# Patient Record
Sex: Male | Born: 1975 | Race: Asian | Hispanic: No | Marital: Single | Smoking: Never smoker
Health system: Southern US, Community
[De-identification: ages and names within clinical notes are randomized; demographics above are authoritative.]

## PROBLEM LIST (undated history)

## (undated) DIAGNOSIS — K219 Gastro-esophageal reflux disease without esophagitis: Secondary | ICD-10-CM

## (undated) HISTORY — PX: REFRACTIVE SURGERY: SHX103

## (undated) HISTORY — DX: Gastro-esophageal reflux disease without esophagitis: K21.9

---

## 2014-11-22 ENCOUNTER — Encounter: Payer: Self-pay | Admitting: Gastroenterology

## 2014-12-09 HISTORY — PX: COLONOSCOPY: SHX174

## 2014-12-16 ENCOUNTER — Ambulatory Visit (INDEPENDENT_AMBULATORY_CARE_PROVIDER_SITE_OTHER): Payer: BLUE CROSS/BLUE SHIELD | Admitting: Gastroenterology

## 2014-12-16 ENCOUNTER — Other Ambulatory Visit: Payer: Self-pay

## 2014-12-16 ENCOUNTER — Encounter: Payer: Self-pay | Admitting: Gastroenterology

## 2014-12-16 VITALS — BP 143/98 | HR 104 | Temp 98.1°F | Ht 67.0 in | Wt 168.8 lb

## 2014-12-16 DIAGNOSIS — R1031 Right lower quadrant pain: Secondary | ICD-10-CM | POA: Diagnosis not present

## 2014-12-16 MED ORDER — LINACLOTIDE 145 MCG PO CAPS: ORAL_CAPSULE | ORAL | Status: DC

## 2014-12-16 NOTE — Assessment & Plan Note (Addendum)
Acute in onset and improved but persistent.  Associated with change in bowel habits and radiates to right groin. DIFFERENTIAL DIAGNOSIS INCLUDES: small bowel enteropathy, less likely Crohn's pf smal lintestine or occult sb mass.  COMPLETE GIVENS CAPSULE TO EVALUATE SMALL BOWEL. COMPLETE ULTRASOUND OF TESTICLES. ADD LINZESS 30 MINS PRIOR TO BREAKFAST. IT MAY CAUSE EXPLOSIVE DIARRHEA. BREAK LINZESS CAPSULES AND PUT IN 4 TBSP WATER. TAKE 2 TBSP DAILY. IF NOT HAVING A BOWEL MOVEMENT 2 OR 3 TIMES A WEEK THAN TAKE 3 TBSP DAILY. Pt will call with questions. OUTPATIENT VISIT IN 6 WEEKS.  GREATER THAN 50% WAS SPENT IN COUNSELING & COORDINATION OF CARE WITH THE PATIENT: DISCUSSED DIFFERENTIAL DIAGNOSIS, PROCEDURE, BENEFITS, RISKS, AND MANAGEMENT OF chronicRLQ abdominal pain/constipation. DISCUSSED BENEFITS, AND MANAGEMENT OF CONSTIPATION AND MEDICATION TITRATION. TOTAL ENCOUNTER TIME: 60 MINS.

## 2014-12-16 NOTE — Patient Instructions (Addendum)
COMPLETE GIVENS CAPSULE TO EVALUATE SMALL BOWEL.  COMPLETE ULTRASOUND OF TESTICLES.  ADD LINZESS 30 MINS PRIOR TO BREAKFAST. IT MAY CAUSE EXPLOSIVE DIARRHEA. BREAK LINZESS CAPSULES AND PUT IN 4 TBSP WATER. TAKE 2 TBSP DAILY. IF YOU ARE NOT HAVING A SATISFACTORY BOWEL MOVEMENT 2 OR 3 TIMES A WEEK THAN TAKE 3 TBSP DAILY.   OUTPATIENT VISIT IN 6 WEEKS.

## 2014-12-16 NOTE — Progress Notes (Signed)
Subjective:    Patient ID: Alexander Aguirre, male    DOB: 1975-09-24, 39 y.o.   MRN: [id number]  Josefa Half, MD  HPI 2 WEEKS PRIOR TO SX: HAD BAD GASTROENTERITIS(LOST WATER DIARRHEA, RUMBLING). HAD SAME THING 3-4 YEARS AGO. DRANK A LOT. NO VOMITING. NO BLOOD STOOL. NO ASPIRIN, BC/GOODY POWDERS, IBUPROFEN/MOTRIN, OR NAPROXEN/ALEVE. ETOH SOMETIMES. Started OCT 2016 ON A SUN. ALWAYS RLQ OFF AND ON(905 IN RLQ, RADIATE TO R GROIN. SAW FRIEND WHO WAS A GI. LABS: NL. CT SCAN: CONSTIPATION. LAST FRI-TCS SMALL POLYP REMOVED. AFTER TCS MIRALAX FOR 2 WEEKS. STARTED IT SAT.  FEELS LITTLE BETTER, BUT PAIN IN RLQ/SUPRPAUBIC AND RADIATED INTO THE GROIN. NO DYSURIA OR HEMATURIA. NO TRAVEL OR ABX. BMs: ONCE A DAY, MAYBE STRAINING. EATS AND THEN HE RUMBLES INSIDE. WEIGHT LOSS: NO CHANGE IN APPETITE: NO. LESS ETOH NOW. USUALLY EXERCISES 4-5 DAYS A WEEK AND NOW NOT EXERCISING(TREADMILL OR ELLIPTICAL, RARELY LIFT WEIGHTS). HAD FEVER/COLD ASSOCIATED WITH URI. NO PAST INJURY OR MVs. LIVING: [employer]. CHEST PAIN ON SUN AFTER TCS AND WENT TO BATHROOM AND FELT FINE.   PT DENIES FEVER, CHILLS, HEMATOCHEZIA, nausea, vomiting, melena, diarrhea, CHEST PAIN, SHORTNESS OF BREATH,  CHANGE IN BOWEL IN HABITS,  problems swallowing, OR  heartburn or indigestion. NO SORES IN MOUTH, RASH ON LEGS, JOINT PAIN/SWELLING, OR BACK PAIN.  No past medical history on file.  Past Surgical History  Procedure Laterality Date  . Refractive surgery    . Colonoscopy  12/09/14    in [place]   No Known Allergies  Current Outpatient Prescriptions  Medication Sig Dispense Refill  . polyethylene glycol (MIRALAX / GLYCOLAX) packet Take 17 g by mouth daily.     No family history on file.  Social History   Social History  . Marital Status: Single    Spouse Name: N/A  . Number of Children: N/A  . Years of Education: N/A   Occupational History  . Not on file.   Social History Main Topics  . Smoking status: Never  Smoker   . Smokeless tobacco: Not on file  . Alcohol Use: 0.0 oz/week    0 Standard drinks or equivalent per week     Comment: 3-4 drinks in a week  . Drug Use: No  . Sexual Activity: Not on file   Review of Systems PER HPI OTHERWISE ALL SYSTEMS ARE NEGATIVE.    Objective:   Physical Exam  Constitutional: He is oriented to person, place, and time. He appears well-developed and well-nourished. No distress.  HENT:  Head: Normocephalic and atraumatic.  Mouth/Throat: Oropharynx is clear and moist. No oropharyngeal exudate.  Eyes: Pupils are equal, round, and reactive to light. No scleral icterus.  Neck: Normal range of motion. Neck supple.  Cardiovascular: Normal rate, regular rhythm and normal heart sounds.   Pulmonary/Chest: Effort normal and breath sounds normal. No respiratory distress.  Abdominal: Soft. Bowel sounds are normal. He exhibits no distension. There is tenderness. There is no rebound and no guarding. Hernia confirmed negative in the right inguinal area and confirmed negative in the left inguinal area.  MILD RLQs TTP   Genitourinary: Testes normal and penis normal. Right testis shows no mass, no swelling and no tenderness. Left testis shows no mass, no swelling and no tenderness.  Musculoskeletal: He exhibits no edema.  Lymphadenopathy:    He has no cervical adenopathy.       Right: No inguinal adenopathy present.  Neurological: He is alert and oriented to person, place,  and time.  NO FOCAL DEFICITS  Skin:  NO PRETIBIAL LESIONS  Psychiatric: He has a normal mood and affect.  Vitals reviewed.     Assessment & Plan:

## 2014-12-16 NOTE — Progress Notes (Signed)
I PERSONALLY [facility] [facility] ([facility]WITH DR. Tyron RussellBOLES. HIS REVIEW REVEALED NO ACUTE INTRA-ABDOMINAL PATHOLOGY. SPOKE WITH MR. Chew. HE IS AWARE.

## 2014-12-20 ENCOUNTER — Other Ambulatory Visit: Payer: Self-pay | Admitting: Gastroenterology

## 2014-12-20 DIAGNOSIS — R1031 Right lower quadrant pain: Secondary | ICD-10-CM

## 2014-12-22 ENCOUNTER — Telehealth: Payer: Self-pay

## 2014-12-22 ENCOUNTER — Ambulatory Visit (HOSPITAL_COMMUNITY)
Admission: RE | Admit: 2014-12-22 | Discharge: 2014-12-22 | Disposition: A | Discharge status: Home / Self Care | Payer: BLUE CROSS/BLUE SHIELD | Source: Ambulatory Visit | Attending: Gastroenterology | Admitting: Gastroenterology

## 2014-12-22 DIAGNOSIS — R1031 Right lower quadrant pain: Secondary | ICD-10-CM | POA: Insufficient documentation

## 2014-12-22 DIAGNOSIS — N503 Cyst of epididymis: Secondary | ICD-10-CM | POA: Diagnosis not present

## 2014-12-22 DIAGNOSIS — R1030 Lower abdominal pain, unspecified: Secondary | ICD-10-CM | POA: Diagnosis not present

## 2014-12-22 DIAGNOSIS — N433 Hydrocele, unspecified: Secondary | ICD-10-CM | POA: Insufficient documentation

## 2014-12-22 NOTE — Telephone Encounter (Signed)
[employer] faxed office in response to clinical notes faxed and approval for Givens Capsule.   [employer] has denied request.    Provider can do peer to peer by calling [phone] ext [phone]   Case # [id number]

## 2014-12-24 ENCOUNTER — Telehealth: Payer: Self-pay | Admitting: Gastroenterology

## 2014-12-24 NOTE — Telephone Encounter (Signed)
Spoke with Dr. Alvester MorinNewton. Will approve GIVENS CAPSULE.

## 2014-12-24 NOTE — Telephone Encounter (Signed)
PLEASE CALL PT. HIS U/S IS NORMAL. I HAD TO DO A PEER TO PEER REVIEW TO GET [employer] TO APPROVE THE GIVENS. THEY APPROVED IT FRI DEC 9. CANDY WILL CONTACT HIM TO SCHEDULE.

## 2014-12-27 ENCOUNTER — Other Ambulatory Visit: Payer: Self-pay

## 2014-12-27 ENCOUNTER — Telehealth: Payer: Self-pay

## 2014-12-27 DIAGNOSIS — R109 Unspecified abdominal pain: Secondary | ICD-10-CM

## 2014-12-27 DIAGNOSIS — R197 Diarrhea, unspecified: Secondary | ICD-10-CM

## 2014-12-27 NOTE — Telephone Encounter (Signed)
LMOM for a return call.  

## 2014-12-27 NOTE — Telephone Encounter (Signed)
Opened in error

## 2014-12-27 NOTE — Telephone Encounter (Signed)
PT is aware and is.

## 2014-12-27 NOTE — Telephone Encounter (Signed)
PA for Alexander Aguirre is [id number]  Expires 01/15/2015  Pt is to come by office to pick up instructions

## 2014-12-28 NOTE — Telephone Encounter (Signed)
Pt picked up instructions.

## 2014-12-28 NOTE — Telephone Encounter (Signed)
Alexander Aguirre was approved. PA # [id number]

## 2014-12-31 ENCOUNTER — Encounter (HOSPITAL_COMMUNITY): Payer: Self-pay

## 2014-12-31 ENCOUNTER — Ambulatory Visit (HOSPITAL_COMMUNITY)
Admission: RE | Admit: 2014-12-31 | Discharge: 2014-12-31 | Disposition: A | Discharge status: Home / Self Care | Payer: BLUE CROSS/BLUE SHIELD | Source: Ambulatory Visit | Attending: Gastroenterology | Admitting: Gastroenterology

## 2014-12-31 ENCOUNTER — Encounter (HOSPITAL_COMMUNITY)
Admission: RE | Disposition: A | Discharge status: Home / Self Care | Payer: Self-pay | Source: Ambulatory Visit | Attending: Gastroenterology

## 2014-12-31 DIAGNOSIS — K633 Ulcer of intestine: Secondary | ICD-10-CM | POA: Insufficient documentation

## 2014-12-31 DIAGNOSIS — R109 Unspecified abdominal pain: Secondary | ICD-10-CM | POA: Diagnosis not present

## 2014-12-31 DIAGNOSIS — R1031 Right lower quadrant pain: Secondary | ICD-10-CM | POA: Insufficient documentation

## 2014-12-31 DIAGNOSIS — R197 Diarrhea, unspecified: Secondary | ICD-10-CM

## 2014-12-31 HISTORY — PX: GIVENS CAPSULE STUDY: SHX5432

## 2014-12-31 SURGERY — IMAGING PROCEDURE, GI TRACT, INTRALUMINAL, VIA CAPSULE

## 2015-01-04 ENCOUNTER — Encounter (HOSPITAL_COMMUNITY): Payer: Self-pay | Admitting: Gastroenterology

## 2015-01-13 ENCOUNTER — Encounter: Payer: Self-pay | Admitting: Gastroenterology

## 2015-01-13 ENCOUNTER — Telehealth: Payer: Self-pay | Admitting: Gastroenterology

## 2015-01-13 NOTE — Telephone Encounter (Addendum)
OPV IN WITH [facility] JAN 19 AT 0815 E30 ABDOMINAL PAIN.

## 2015-01-13 NOTE — Telephone Encounter (Signed)
Called patient TO DISCUSS RESULTS. STILL HAVING DULL ACHE. HAS GOOD DAYS AND BAD DAYS. BEEN A LITTLE BETTER OVER PAST 2 WEEK AS OF LAST FRI AFTER A PROBIOTIC(ALIGN). NOT HAVING DIARRHEA NOW. FIRST STARTED WITH BAD CHICKEN OCT 2016. CLEARED AFTER GATORADE. 2 WEEKS LATER ATE SAME CHICKEN NOV 2016 AN 1 WEEK PRIOR TO THE PAIN. FELT CONSTIPATED. NO ASPIRIN, BC/GOODY POWDERS, IBUPROFEN/MOTRIN, OR NAPROXEN/ALEVE. WAS TAKING CLA TO LOSE WEIGHT. RARE ETOH. NO KNOWN CELIAC SPRUE. PT WILL CALL IN 2-3 WEEKS IF ABDOMINAL PAIN IS NOT BETTER. DISCUSSED POSSIBLE EGD/DUODENAL BX V. TTG IgA AND/OR TCS WITH ILEAL BIOPSIES.

## 2015-01-18 NOTE — Telephone Encounter (Signed)
NO OPENING THAT DAY EXCEPT FOR 11:30 URGENT OV   PLEASE ADVISE

## 2015-01-20 NOTE — Brief Op Note (Signed)
12/31/2014  11:30 AM  PATIENT:  Alexander Aguirre  40 y.o. male  PRE-OPERATIVE DIAGNOSIS: abdominal pain-DENIES ASA/NSAIDs  POST-OPERATIVE DIAGNOSIS:  SMALL ILEAL ULCERS  PROCEDURE:  Procedure(s) with comments: GIVENS CAPSULE STUDY (N/A) - 0700  SURGEON:  Surgeon(s) and Role:    * West BaliSandi L Bijan Ridgley, MD - Primary   PATIENT DATA: 167 LBS, HEIGHT: 67 IN, WAIST: 36  IN, GASTRIC PASSAGE TIME: 16 m, SB PASSAGE TIME: 4H 233m  RESULTS: LIMITED views of gastric mucosa due to retained contents. No blood in the stomach. SMALL ULCERS SEEN IN SMALL BOWEL No masses, or AVMs SEEN. NO OLD BLOOD OR FRESH BLOOD SEEN. LIMITED VIEWS OF THE COLON DUE TO RETAINED CONTENTS.  DIAGNOSIS: ABDOMINAL PAIN POSSIBLE DUE TO NONSPECIFIC ILEITIS, CROHN'S DISEASE, OR SECONDARY TO CELIAC SPRUE, DOUBT SB LYMPHOMA OR CARCINOID.  Plan: 1. PT WILL CALL IN 2-3 WEEKS IF ABDOMINAL PAIN IS NOT BETTER. DISCUSSED POSSIBLE EGD/DUODENAL BX V. TTG IgA AND/OR TCS WITH ILEAL BIOPSIES  Jan 19 1137: spoke with [name] PRIOR IELOSCOPY NORMAL. PT MAY NEED EGD/RETROGRADE DBE AND/OR TTG IgA.RETROGRADE DBE.

## 2015-01-24 ENCOUNTER — Encounter: Payer: Self-pay | Admitting: Gastroenterology

## 2015-01-25 NOTE — Telephone Encounter (Signed)
APPT MADE AND PATIENT IS AWARE TO BE HERE AT 8:15AM

## 2015-01-25 NOTE — Telephone Encounter (Addendum)
PUT PT IN 1130 SLOT. HE WILL ARRIVE AT [id number]. HE NEEDS AN EARLY AM APPT.

## 2015-01-26 ENCOUNTER — Other Ambulatory Visit: Payer: Self-pay

## 2015-01-26 MED ORDER — NA SULFATE-K SULFATE-MG SULF 17.5-3.13-1.6 GM/177ML PO SOLN: 1.0000 | Freq: Once | ORAL | Status: DC

## 2015-01-27 ENCOUNTER — Other Ambulatory Visit: Payer: Self-pay

## 2015-01-27 ENCOUNTER — Telehealth: Payer: Self-pay | Admitting: Gastroenterology

## 2015-01-27 DIAGNOSIS — R1031 Right lower quadrant pain: Secondary | ICD-10-CM

## 2015-01-27 DIAGNOSIS — K529 Noninfective gastroenteritis and colitis, unspecified: Secondary | ICD-10-CM

## 2015-01-27 DIAGNOSIS — D649 Anemia, unspecified: Secondary | ICD-10-CM

## 2015-01-27 NOTE — Telephone Encounter (Signed)
PT CALLED HAVING NAGGING RLQ ABDOMINAL PAIN. PROBIOTIC IMPROVED PAIN BUT HASN'T RESOLVED. NO DIARRHEA. GIVENS HAS ILEAL ULCERS. NEED EGD/TCS FRI 0730 SUPREP PER PT REQUEST. DISCUSSED PROCEDURE, BENEFITS, AND RISKS OF PROCEDURE.

## 2015-01-27 NOTE — Telephone Encounter (Signed)
Ordered Entered. Pt aware of new arrival time

## 2015-01-28 ENCOUNTER — Ambulatory Visit (HOSPITAL_COMMUNITY)
Admission: RE | Admit: 2015-01-28 | Discharge: 2015-01-28 | Disposition: A | Discharge status: Home / Self Care | Payer: BLUE CROSS/BLUE SHIELD | Source: Ambulatory Visit | Attending: Gastroenterology | Admitting: Gastroenterology

## 2015-01-28 ENCOUNTER — Encounter (HOSPITAL_COMMUNITY): Payer: Self-pay | Admitting: *Deleted

## 2015-01-28 ENCOUNTER — Encounter (HOSPITAL_COMMUNITY)
Admission: RE | Disposition: A | Discharge status: Home / Self Care | Payer: Self-pay | Source: Ambulatory Visit | Attending: Gastroenterology

## 2015-01-28 DIAGNOSIS — K529 Noninfective gastroenteritis and colitis, unspecified: Secondary | ICD-10-CM | POA: Diagnosis not present

## 2015-01-28 DIAGNOSIS — D649 Anemia, unspecified: Secondary | ICD-10-CM

## 2015-01-28 DIAGNOSIS — Q438 Other specified congenital malformations of intestine: Secondary | ICD-10-CM | POA: Insufficient documentation

## 2015-01-28 DIAGNOSIS — R1031 Right lower quadrant pain: Secondary | ICD-10-CM | POA: Diagnosis not present

## 2015-01-28 HISTORY — PX: COLONOSCOPY: SHX5424

## 2015-01-28 HISTORY — PX: COLONOSCOPY: SHX174

## 2015-01-28 HISTORY — PX: ESOPHAGOGASTRODUODENOSCOPY: SHX1529

## 2015-01-28 HISTORY — PX: ESOPHAGOGASTRODUODENOSCOPY: SHX5428

## 2015-01-28 SURGERY — EGD (ESOPHAGOGASTRODUODENOSCOPY)
Anesthesia: Moderate Sedation

## 2015-01-28 MED ORDER — PROMETHAZINE HCL 25 MG/ML IJ SOLN
INTRAMUSCULAR | Status: DC | PRN
  Administered 2015-01-28: 12.5 mg via INTRAVENOUS

## 2015-01-28 MED ORDER — STERILE WATER FOR IRRIGATION IR SOLN
Status: DC | PRN
  Administered 2015-01-28: 08:00:00

## 2015-01-28 MED ORDER — LIDOCAINE VISCOUS 2 % MT SOLN
OROMUCOSAL | Status: AC
  Filled 2015-01-28: qty 15

## 2015-01-28 MED ORDER — SODIUM CHLORIDE 0.9 % IJ SOLN
INTRAMUSCULAR | Status: AC
  Filled 2015-01-28: qty 3

## 2015-01-28 MED ORDER — SODIUM CHLORIDE 0.9 % IV SOLN
INTRAVENOUS | Status: DC
  Administered 2015-01-28: 1000 mL via INTRAVENOUS

## 2015-01-28 MED ORDER — MIDAZOLAM HCL 5 MG/5ML IJ SOLN
INTRAMUSCULAR | Status: DC | PRN
  Administered 2015-01-28: 1 mg via INTRAVENOUS
  Administered 2015-01-28 (×2): 2 mg via INTRAVENOUS

## 2015-01-28 MED ORDER — MEPERIDINE HCL 100 MG/ML IJ SOLN
INTRAMUSCULAR | Status: AC
  Filled 2015-01-28: qty 2

## 2015-01-28 MED ORDER — PROMETHAZINE HCL 25 MG/ML IJ SOLN
INTRAMUSCULAR | Status: AC
  Filled 2015-01-28: qty 1

## 2015-01-28 MED ORDER — MEPERIDINE HCL 100 MG/ML IJ SOLN
INTRAMUSCULAR | Status: DC | PRN
  Administered 2015-01-28: 25 mg via INTRAVENOUS
  Administered 2015-01-28: 50 mg via INTRAVENOUS
  Administered 2015-01-28: 25 mg via INTRAVENOUS

## 2015-01-28 MED ORDER — MIDAZOLAM HCL 5 MG/5ML IJ SOLN
INTRAMUSCULAR | Status: AC
  Filled 2015-01-28: qty 10

## 2015-01-28 MED ORDER — LIDOCAINE VISCOUS 2 % MT SOLN
OROMUCOSAL | Status: DC | PRN
Start: 2015-01-28 — End: 2015-01-28
  Administered 2015-01-28: 1 via OROMUCOSAL

## 2015-01-28 NOTE — H&P (Signed)
  Primary Care Physician:  Cristal Ford, MD Primary Gastroenterologist:  Dr. Oneida Alar  Pre-Procedure History & Physical: HPI:  Alexander Aguirre is a 40 y.o. male here for Abdominal pain/ileitis on GIVENS.  History reviewed. No pertinent past medical history.  Past Surgical History  Procedure Laterality Date  . Refractive surgery    . Colonoscopy  12/09/14    in [place]  . Givens capsule study N/A 12/31/2014    Procedure: GIVENS CAPSULE STUDY;  Surgeon: Danie Binder, MD;  Location: [facility];  Service: Endoscopy;  Laterality: N/A;  0700    Prior to Admission medications   Medication Sig Start Date End Date Taking? Authorizing Provider  acidophilus (RISAQUAD) CAPS capsule Take 1 capsule by mouth daily.   Yes Historical Provider, MD  Na Sulfate-K Sulfate-Mg Sulf SOLN Take 1 kit by mouth once. 01/26/15 02/25/15 Yes Danie Binder, MD  Linaclotide Rolan Lipa) 145 MCG CAPS capsule 1 PO 30 mins prior to your first meal 12/16/14   Danie Binder, MD  polyethylene glycol (MIRALAX / GLYCOLAX) packet Take 17 g by mouth daily.    Historical Provider, MD    Allergies as of 01/27/2015  . (No Known Allergies)    Family History  Problem Relation Age of Onset  . Colon polyps Father   . Colon cancer Neg Hx     Social History   Social History  . Marital Status: Single    Spouse Name: N/A  . Number of Children: N/A  . Years of Education: N/A   Occupational History  . Not on file.   Social History Main Topics  . Smoking status: Never Smoker   . Smokeless tobacco: Not on file  . Alcohol Use: 0.0 oz/week    0 Standard drinks or equivalent per week     Comment: 3-4 drinks in a week  . Drug Use: No  . Sexual Activity: Not on file   Other Topics Concern  . Not on file   Social History Narrative    Review of Systems: See HPI, otherwise negative ROS   Physical Exam: BP 115/84 mmHg  Pulse 90  Temp(Src) 98.5 F (36.9 C) (Oral)  Resp 14  Ht '5\' 7"'$  (1.702 m)  Wt 165  lb (74.844 kg)  BMI 25.84 kg/m2  SpO2 97% General:   Alert,  pleasant and cooperative in NAD Head:  Normocephalic and atraumatic. Neck:  Supple; Lungs:  Clear throughout to auscultation.    Heart:  Regular rate and rhythm. Abdomen:  Soft, nontender and nondistended. Normal bowel sounds, without guarding, and without rebound.   Neurologic:  Alert and  oriented x4;  grossly normal neurologically.  Impression/Plan:    Abdominal pain/ileitis on GIVENS  PLAN: 1. TCS/EGD/POSSIBLE ENTEROSCOPY TODAY. DISCUSSED PROCEDURE, BENEFITS, & RISKS: < 1% chance of medication reaction, bleeding, perforation, or rupture of spleen/liver.

## 2015-01-28 NOTE — Discharge Instructions (Signed)
Your colon and small bowel looks normal.  I biopsied your stomach and small bowel.   YOUR BIOPSY RESULTS WILL BE AVAILABLE IN MY CHART JAN 17.  CONTINUE  PROBIOTIC DAILY.  USE MIRALAX OR LINZESS IF NEEDED FOR CONSTIPATION.  ENDOSCOPY Care After Read the instructions outlined below and refer to this sheet in the next week. These discharge instructions provide you with general information on caring for yourself after you leave the hospital. While your treatment has been planned according to the most current medical practices available, unavoidable complications occasionally occur. If you have any problems or questions after discharge, call DR. Hutton Pellicane, [phone].  ACTIVITY  You may resume your regular activity, but move at a slower pace for the next 24 hours.   Take frequent rest periods for the next 24 hours.   Walking will help get rid of the air and reduce the bloated feeling in your belly (abdomen).   No driving for 24 hours (because of the medicine (anesthesia) used during the test).   You may shower.   Do not sign any important legal documents or operate any machinery for 24 hours (because of the anesthesia used during the test).    NUTRITION  Drink plenty of fluids.   You may resume your normal diet as instructed by your doctor.   Begin with a light meal and progress to your normal diet. Heavy or fried foods are harder to digest and may make you feel sick to your stomach (nauseated).   Avoid alcoholic beverages for 24 hours or as instructed.    MEDICATIONS  You may resume your normal medications.   WHAT YOU CAN EXPECT TODAY  Some feelings of bloating in the abdomen.   Passage of more gas than usual.   Spotting of blood in your stool or on the toilet paper  .  IF YOU HAD POLYPS REMOVED DURING THE ENDOSCOPY:  Eat a soft diet IF YOU HAVE NAUSEA, BLOATING, ABDOMINAL PAIN, OR VOMITING.    FINDING OUT THE RESULTS OF YOUR TEST Not all test results are  available during your visit. DR. Darrick Penna WILL CALL YOU WITHIN 7 DAYS OF YOUR PROCEDUE WITH YOUR RESULTS. Do not assume everything is normal if you have not heard from DR. Lativia Velie IN ONE WEEK, CALL HER OFFICE AT [phone].  SEEK IMMEDIATE MEDICAL ATTENTION AND CALL THE OFFICE: [phone] IF:  You have more than a spotting of blood in your stool.   Your belly is swollen (abdominal distention).   You are nauseated or vomiting.   You have a temperature over 101F.   You have abdominal pain or discomfort that is severe or gets worse throughout the day.  HIGH FIBER DIET A high-fiber diet changes your normal diet to include more whole grains, legumes, fruits, and vegetables. Changes in the diet involve replacing refined carbohydrates with unrefined foods. The calorie level of the diet is essentially unchanged. The Dietary Reference Intake (recommended amount) for adult males is 38 grams per day. For adult females, it is 25 grams per day. Pregnant and lactating women should consume 28 grams of fiber per day. Fiber is the intact part of a plant that is not broken down during digestion. Functional fiber is fiber that has been isolated from the plant to provide a beneficial effect in the body. PURPOSE  Increase stool bulk.   Ease and regulate bowel movements.   Lower cholesterol.  REDUCE RISK OF COLON CANCER  INDICATIONS THAT YOU NEED MORE FIBER  Constipation and hemorrhoids.  Uncomplicated diverticulosis (intestine condition) and irritable bowel syndrome.   Weight management.   As a protective measure against hardening of the arteries (atherosclerosis), diabetes, and cancer.   GUIDELINES FOR INCREASING FIBER IN THE DIET  Start adding fiber to the diet slowly. A gradual increase of about 5 more grams (2 slices of whole-wheat bread, 2 servings of most fruits or vegetables, or 1 bowl of high-fiber cereal) per day is best. Too rapid an increase in fiber may result in constipation,  flatulence, and bloating.   Drink enough water and fluids to keep your urine clear or pale yellow. Water, juice, or caffeine-free drinks are recommended. Not drinking enough fluid may cause constipation.   Eat a variety of high-fiber foods rather than one type of fiber.   Try to increase your intake of fiber through using high-fiber foods rather than fiber pills or supplements that contain small amounts of fiber.   The goal is to change the types of food eaten. Do not supplement your present diet with high-fiber foods, but replace foods in your present diet.   INCLUDE A VARIETY OF FIBER SOURCES  Replace refined and processed grains with whole grains, canned fruits with fresh fruits, and incorporate other fiber sources. White rice, white breads, and most bakery goods contain little or no fiber.   Brown whole-grain rice, buckwheat oats, and many fruits and vegetables are all good sources of fiber. These include: broccoli, Brussels sprouts, cabbage, cauliflower, beets, sweet potatoes, white potatoes (skin on), carrots, tomatoes, eggplant, squash, berries, fresh fruits, and dried fruits.   Cereals appear to be the richest source of fiber. Cereal fiber is found in whole grains and bran. Bran is the fiber-rich outer coat of cereal grain, which is largely removed in refining. In whole-grain cereals, the bran remains. In breakfast cereals, the largest amount of fiber is found in those with "bran" in their names. The fiber content is sometimes indicated on the label.   You may need to include additional fruits and vegetables each day.   In baking, for 1 cup white flour, you may use the following substitutions:   1 cup whole-wheat flour minus 2 tablespoons.   1/2 cup white flour plus 1/2 cup whole-wheat flour.

## 2015-01-28 NOTE — Op Note (Addendum)
[facility] [address] [place], [id number]   COLONOSCOPY PROCEDURE REPORT  PATIENT: Alexander Aguirre, Alexander Aguirre  MR#: [id number] BIRTHDATE: 1975-04-29 , 39  yrs. old GENDER: male ENDOSCOPIST: West BaliSandi L Dalissa Lovin, MD REFERRED BY: PROCEDURE DATE:  01/28/2015 PROCEDURE:   Colonoscopy with biopsy INDICATIONS:abdominal pain in the lower right quadrant and ileitis on GIVENS DEC 2016. MEDICATIONS: Demerol 75 mg IV, Versed 5 mg IV, and PHENERGAN 12.5 MG IV  MD initiated/ordered: [id number]. last dose 0838. end of endoscopy: 0920  DESCRIPTION OF PROCEDURE:    Physical exam was performed.  Informed consent was obtained from the patient after explaining the benefits, risks, and alternatives to procedure.  The patient was connected to monitor and placed in left lateral position. Continuous oxygen was provided by nasal cannula and IV medicine administered through an indwelling cannula.  After administration of sedation and rectal exam, the patients rectum was intubated and the [id number] ([id number]  colonoscope was advanced under direct visualization to the ileum.  The scope was removed slowly by carefully examining the color, texture, anatomy, and integrity mucosa on the way out.  The patient was recovered in endoscopy and discharged home in satisfactory condition. Estimated blood loss is zero unless otherwise noted in this procedure report.    COLON FINDINGS: The examined terminal ileum appeared to be normal.  20 CM VISUALIZED. Multiple biopsies were performed using cold forceps.  REDUNDANT LEFT COLON. OTHERWISE, the colonic mucosa appeared normal throughout the entire examined colon.  Multiple biopsies were performed using cold forceps.  , and The entire examined rectum was normal.  PREP QUALITY: excellent. CECAL W/D TIME: 24       minutes  COMPLICATIONS: None  ENDOSCOPIC IMPRESSION: 1.   NO SOURCE FOR ABDOMINAL PAIN IDENTIFIED 2.   NORMAL ILEUM, COLON AND  rectum  RECOMMENDATIONS: AWAIT BIOPSY. HIGH FIBER DIET CONSIDER EXPLORATORY LAPAROTOMY      _______________________________ Rosalie DoctoreSignedWest Bali:  Mora Pedraza L Lovada Barwick, MD 01/28/2015 9:21 AM Revised: 01/28/2015 9:21 AM  CPT CODES: ICD CODES:  The ICD and CPT codes recommended by this software are interpretations from the data that the clinical staff has captured with the software.  The verification of the translation of this report to the ICD and CPT codes and modifiers is the sole responsibility of the health care institution and practicing physician where this report was generated.  [employer] will not be held responsible for the validity of the ICD and CPT codes included on this report.  AMA assumes no liability for data contained or not contained herein. CPT is a Publishing rights managerregistered trademark of the [employer].    PATIENT NAME:  Alexander Aguirre, Jahmire MR#: [id number]

## 2015-01-28 NOTE — Op Note (Signed)
[facility] [address] [place], [id number]   ENDOSCOPY PROCEDURE REPORT  PATIENT: Alexander Aguirre, Alexander Aguirre  MR#: [id number] BIRTHDATE: May 23, 1975 , 39  yrs. old GENDER: male  ENDOSCOPIST: West BaliSandi L Fields, MD REFERRED BY:  PROCEDURE DATE: 01/28/2015 PROCEDURE:   EGD w/ biopsy  INDICATIONS:ileitis on GIVENS DEC 2016. MEDICATIONS: TCS + NONE        MD initiated/ordered: [id number]. last dose 0838. end of endoscopy: 0920  TOPICAL ANESTHETIC:   Viscous Xylocaine ASA CLASS:  DESCRIPTION OF PROCEDURE:     Physical exam was performed.  Informed consent was obtained from the patient after explaining the benefits, risks, and alternatives to the procedure.  The patient was connected to the monitor and placed in the left lateral position.  Continuous oxygen was provided by nasal cannula and IV medicine administered through an indwelling cannula.  After administration of sedation, the patients esophagus was intubated and the [id number] ([id number]  endoscope was advanced under direct visualization to the second portion of the duodenum.  The scope was removed slowly by carefully examining the color, texture, anatomy, and integrity of the mucosa on the way out.  The patient was recovered in endoscopy and discharged home in satisfactory condition.  Estimated blood loss is zero unless otherwise noted in this procedure report.     ESOPHAGUS: The mucosa of the esophagus appeared normal.   STOMACH: The mucosa of the stomach appeared normal.   DUODENUM: The duodenal mucosa showed no abnormalities in the bulb and 2nd part of the duodenum.  Cold forceps biopsies were taken in the bulb and second portion.  COMPLICATIONS: There were no immediate complications.  ENDOSCOPIC IMPRESSION: 1.   NO SOURCE FOR ABDOMINAL PAIN IDENTIFIED  RECOMMENDATIONS: AWAIT BIOPSY HIGH FIBER DIET CONTINUE PROBIOTIC DAILY  REPEAT EXAM:  eSigned:  West BaliSandi L Fields, MD 01/28/2015 9:25 AM  CPT CODES: ICD  CODES:  The ICD and CPT codes recommended by this software are interpretations from the data that the clinical staff has captured with the software.  The verification of the translation of this report to the ICD and CPT codes and modifiers is the sole responsibility of the health care institution and practicing physician where this report was generated.  [employer] will not be held responsible for the validity of the ICD and CPT codes included on this report.  AMA assumes no liability for data contained or not contained herein. CPT is a Publishing rights managerregistered trademark of the [employer].

## 2015-02-01 ENCOUNTER — Encounter (HOSPITAL_COMMUNITY): Payer: Self-pay | Admitting: Gastroenterology

## 2015-02-02 ENCOUNTER — Telehealth: Payer: Self-pay | Admitting: General Practice

## 2015-02-02 MED ORDER — LEVOFLOXACIN 500 MG PO TABS: 500.0000 mg | ORAL_TABLET | Freq: Every day | ORAL | Status: DC

## 2015-02-02 NOTE — Telephone Encounter (Signed)
Patient called and wanted to know if he could get his results back from his TCS that was done last week.  Also, he would like to know if he should come in for his ov tomorrow with [facility].   Routing to Dr. Darrick Penna

## 2015-02-02 NOTE — Telephone Encounter (Signed)
Called patient TO DISCUSS CONCERNS. HIS COLON AND SMALL BOWEL BIOPSIES ARE ESSENTIALLY NORMAL. HIS MANAGEMENT OPTION ARE: 1. EMPIRIC COURSE OF ABX FOR 21 DAYS TO TREAT EPIDIDYMITIS OR AND/OR 2. GENERAL SURGERY REFERRAL FOR EXPLORATORY LAPAROTOMY.   PT WOULD LIKE TO TRY LEVAQUIN 500 MG DAILY AT 6 PM FOR 14 DAYS. DISCUSSED POSSIBILITY OF C DIFF. CONTINUE PROBIOTIC EVERY AM. STOPPED LINZESS DUE TO LOOSE STOOLS. RELIEVING CONSTIPATION DID NOT HELP. PT WILL CALL AFTER 7 DAYS AND LET ME KNOW ABOUT HIS PAIN.

## 2015-02-02 NOTE — Telephone Encounter (Signed)
CANCEL OPV THUR JAN 19.

## 2015-02-03 ENCOUNTER — Ambulatory Visit: Payer: BLUE CROSS/BLUE SHIELD | Admitting: Gastroenterology

## 2015-02-03 NOTE — Telephone Encounter (Signed)
DISCUSSED WITH PT

## 2015-02-11 ENCOUNTER — Telehealth: Payer: Self-pay | Admitting: Gastroenterology

## 2015-02-11 MED ORDER — LEVOFLOXACIN 500 MG PO TABS: 500.0000 mg | ORAL_TABLET | Freq: Every day | ORAL | Status: DC

## 2015-02-11 NOTE — Telephone Encounter (Signed)
COMPLETE LEVAQUIN FOR 28 days.

## 2015-03-08 ENCOUNTER — Telehealth: Payer: Self-pay | Admitting: Gastroenterology

## 2015-03-08 MED ORDER — DOXYCYCLINE HYCLATE 100 MG PO CAPS: 100.0000 mg | ORAL_CAPSULE | Freq: Two times a day (BID) | ORAL | Status: DC

## 2015-03-08 NOTE — Telephone Encounter (Signed)
Pt called. Still has R groin pain after 28 days of Levaquin.  Symptoms better but not resolved. Start Doxycycline BID for 14 days. .MED SIDE EFFECTS DISCUSSED. Urology referral for R groin pain. Call in 10 days if Sx not resolved.

## 2015-03-09 ENCOUNTER — Other Ambulatory Visit: Payer: Self-pay

## 2015-03-09 DIAGNOSIS — R1031 Right lower quadrant pain: Secondary | ICD-10-CM

## 2015-03-09 NOTE — Telephone Encounter (Signed)
Referral sent 

## 2015-03-22 MED ORDER — DOXYCYCLINE HYCLATE 100 MG PO CAPS: 100.0000 mg | ORAL_CAPSULE | Freq: Two times a day (BID) | ORAL | Status: DC

## 2015-03-22 NOTE — Addendum Note (Signed)
Addended by: West BaliFIELDS, SANDI L on: 03/22/2015 05:25 PM   Modules accepted: Orders

## 2015-03-22 NOTE — Telephone Encounter (Addendum)
Pt has appt with UROLOGY MAR 28. SEEING IMPROVEMENT AFTER DOXYCYCLINE BUT NOT COMPLETE RESOLUTION OF SYMPTOMS AFTER 14 DAYS. TOLERATING DOXYCYCLINE MUCH BETTER THAN LEVAQUIN. CONTINUE DOXYCYCLINE FOR ADDITIONAL 14 DAYS. NOT HAVING PROBLEMS WITH DIARRHEA AT THIS TIME. MILD INTERMITTENT R GROIN PAIN AT THIS POINT. FEELS PAIN IN RLQ AS WELL. ABX CAUSING A SOME MILD CONSTIPATION.

## 2015-04-12 ENCOUNTER — Ambulatory Visit (INDEPENDENT_AMBULATORY_CARE_PROVIDER_SITE_OTHER): Payer: BLUE CROSS/BLUE SHIELD | Admitting: Urology

## 2015-04-12 DIAGNOSIS — R1030 Lower abdominal pain, unspecified: Secondary | ICD-10-CM

## 2015-05-24 ENCOUNTER — Ambulatory Visit (INDEPENDENT_AMBULATORY_CARE_PROVIDER_SITE_OTHER): Payer: BLUE CROSS/BLUE SHIELD | Admitting: Urology

## 2015-05-24 DIAGNOSIS — R1030 Lower abdominal pain, unspecified: Secondary | ICD-10-CM

## 2015-05-31 ENCOUNTER — Telehealth: Payer: Self-pay | Admitting: Neurology

## 2015-05-31 NOTE — Telephone Encounter (Signed)
[facility] [id number] Kamel,Joe WILLIS [phone] * [phone] -LOOKING FOR APPT-WANTS CALL BACK TODAY Y   Called Darel HongJudy today, transferred call to Diane/Referrals

## 2015-06-01 ENCOUNTER — Encounter: Payer: Self-pay | Admitting: Neurology

## 2015-06-01 ENCOUNTER — Ambulatory Visit (INDEPENDENT_AMBULATORY_CARE_PROVIDER_SITE_OTHER): Payer: BLUE CROSS/BLUE SHIELD | Admitting: Neurology

## 2015-06-01 VITALS — BP 118/76 | HR 78 | Ht 67.0 in | Wt 176.0 lb

## 2015-06-01 DIAGNOSIS — R202 Paresthesia of skin: Secondary | ICD-10-CM | POA: Diagnosis not present

## 2015-06-01 NOTE — Progress Notes (Signed)
Reason for visit: Abdominal pain, paresthesias  Referring physician: Dr. Pilar Jarvis is a 40 y.o. male  History of present illness:  Alexander Aguirre is a 40 year old right-handed [place] male with a history of onset of abdominal pain that began around 11/07/2014. The patient indicated that he ate some chicken and he believes that he may have had food poisoning from this. The patient has had some persistent discomfort in the right lower quadrant associated with borborygmi at times. The patient has a dull achy pain in this region with some projection of pain into the groin area and scrotum on the right. The patient has undergone a CBC and comprehensive metabolic profile that was unremarkable. He has undergone colonoscopy procedures on 2 occasions and an upper endoscopy procedure with biopsies to exclude celiac disease. These studies were relatively unremarkable. The source of the abdominal discomfort has not been determined. The patient has been seen by urology and is not felt that he has a urologic source of pain. He has been given trials on antibiotics including levofloxacin and doxycycline without much benefit. The patient has recently noted some tingly sensations around the umbilicus and at times into the right thigh and right foot. This is not a persistent symptom however, but the right lower quadrant dull achy pain is persistent. He denies any issues controlling the bowels or the bladder. He does have some occasional low back pain. He denies any neck discomfort. He has not had any constitutional symptoms of weight loss, fevers or chills, or night sweats. He is sent to this office for an evaluation. The patient denies any skin rash around the torso at the time of onset of symptoms.  History reviewed. No pertinent past medical history.  Past Surgical History  Procedure Laterality Date  . Refractive surgery    . Colonoscopy  12/09/14    in [facility]  . Givens capsule study N/A  12/31/2014    Procedure: GIVENS CAPSULE STUDY;  Surgeon: West Bali, MD;  [facility];  Service: Endoscopy;  Laterality: N/A;  0700  . Esophagogastroduodenoscopy  01/28/2015    SLF: 1. No source fir abdominal pain identified  . Colonoscopy  01/28/2015    SLF: 1. No source for abdominal pain identified 2. Normal ileum, colon and rectum  . Esophagogastroduodenoscopy N/A 01/28/2015    Procedure: ESOPHAGOGASTRODUODENOSCOPY (EGD);  Surgeon: West Bali, MD;  [facility];  Service: Endoscopy;  Laterality: N/A;  0730  . Colonoscopy N/A 01/28/2015    Procedure: COLONOSCOPY;  Surgeon: West Bali, MD;  [facility];  Service: Endoscopy;  Laterality: N/A;    Family History  Problem Relation Age of Onset  . Colon polyps Father   . Colon cancer Neg Hx     Social history:  reports that he has never smoked. He does not have any smokeless tobacco history on file. He reports that he does not drink alcohol or use illicit drugs.  Medications:  Prior to Admission medications   Medication Sig Start Date End Date Taking? Authorizing Provider  acidophilus (RISAQUAD) CAPS capsule Take 1 capsule by mouth daily.   Yes Historical Provider, MD  alfuzosin (UROXATRAL) 10 MG 24 hr tablet  05/11/15  Yes Historical Provider, MD     No Known Allergies  ROS:  Out of a complete 14 system review of symptoms, the patient complains only of the following symptoms, and all other reviewed systems are negative.  Weakness Paresthesias  Blood  pressure 118/76, pulse 78, height 5\' 7"  (1.702 m), weight 176 lb (79.833 kg).  Physical Exam  General: The patient is alert and cooperative at the time of the examination. The patient is minimally obese.  Eyes: Pupils are equal, round, and reactive to light. Discs are flat bilaterally.  Neck: The neck is supple, no carotid bruits are noted.  Respiratory: The respiratory examination is clear.  Cardiovascular: The cardiovascular  examination reveals a regular rate and rhythm, no obvious murmurs or rubs are noted.  Skin: Extremities are without significant edema.  Neurologic Exam  Mental status: The patient is alert and oriented x 3 at the time of the examination. The patient has apparent normal recent and remote memory, with an apparently normal attention span and concentration ability.  Cranial nerves: Facial symmetry is present. There is good sensation of the face to pinprick and soft touch bilaterally. The strength of the facial muscles and the muscles to head turning and shoulder shrug are normal bilaterally. Speech is well enunciated, no aphasia or dysarthria is noted. Extraocular movements are full. Visual fields are full. The tongue is midline, and the patient has symmetric elevation of the soft palate. No obvious hearing deficits are noted.  Motor: The motor testing reveals 5 over 5 strength of all 4 extremities. Good symmetric motor tone is noted throughout.  Sensory: Sensory testing is intact to pinprick, soft touch, vibration sensation, and position sense on all 4 extremities. No evidence of extinction is noted.  Coordination: Cerebellar testing reveals good finger-nose-finger and heel-to-shin bilaterally.  Gait and station: Gait is normal. Tandem gait is normal. Romberg is negative. No drift is seen.  Reflexes: Deep tendon reflexes are symmetric and normal bilaterally. Toes are downgoing bilaterally.   Assessment/Plan:  1. Right lower quadrant discomfort, persistent  2. Right lower extremity paresthesias  The patient has a normal clinical examination at this time. He has had symptoms of right lower quadrant discomfort, and some intermittent tingling sensations in the right leg and around the umbilicus. The patient will be evaluated for a spinal cord pathology, demyelinating disease. The patient will be sent for blood work today, he will undergo MRI of the thoracic spinal cord with and without gadolinium  enhancement. If the studies are unremarkable, MRI of the brain may be done. The patient does not report any symptoms involving the arms or face.  Alexander Aguirre. Alexander Willis MD 06/01/2015 8:23 PM  [facility] [address] [place] [id number]  Phone [phone] Fax [phone]

## 2015-06-02 LAB — COMPREHENSIVE METABOLIC PANEL
ALBUMIN: 5 g/dL (ref 3.5–5.5)
ALT: 22 IU/L (ref 0–44)
AST: 21 IU/L (ref 0–40)
Albumin/Globulin Ratio: 1.7 (ref 1.2–2.2)
Alkaline Phosphatase: 62 IU/L (ref 39–117)
BUN / CREAT RATIO: 10 (ref 9–20)
BUN: 11 mg/dL (ref 6–20)
Bilirubin Total: 0.8 mg/dL (ref 0.0–1.2)
CALCIUM: 9.9 mg/dL (ref 8.7–10.2)
CO2: 23 mmol/L (ref 18–29)
CREATININE: 1.05 mg/dL (ref 0.76–1.27)
Chloride: 98 mmol/L (ref 96–106)
GFR calc Af Amer: 104 mL/min/{1.73_m2} (ref >59)
GFR, EST NON AFRICAN AMERICAN: 90 mL/min/{1.73_m2} (ref >59)
GLOBULIN, TOTAL: 2.9 g/dL (ref 1.5–4.5)
Glucose: 104 mg/dL — ABNORMAL HIGH (ref 65–99)
Potassium: 4.4 mmol/L (ref 3.5–5.2)
SODIUM: 140 mmol/L (ref 134–144)
TOTAL PROTEIN: 7.9 g/dL (ref 6.0–8.5)

## 2015-06-02 LAB — SEDIMENTATION RATE: Sed Rate: 17 mm/hr — ABNORMAL HIGH (ref 0–15)

## 2015-06-02 LAB — B. BURGDORFI ANTIBODIES

## 2015-06-02 LAB — ANGIOTENSIN CONVERTING ENZYME: ANGIO CONVERT ENZYME: 27 U/L (ref 14–82)

## 2015-06-02 LAB — ANA W/REFLEX: Anti Nuclear Antibody(ANA): NEGATIVE

## 2015-06-02 LAB — RHEUMATOID FACTOR: RHEUMATOID FACTOR: 34.5 [IU]/mL — AB (ref 0.0–13.9)

## 2015-06-02 LAB — VITAMIN B12: Vitamin B-12: 356 pg/mL (ref 211–946)

## 2015-06-02 LAB — HIV ANTIBODY (ROUTINE TESTING W REFLEX): HIV Screen 4th Generation wRfx: NONREACTIVE

## 2015-06-02 LAB — RPR: RPR Ser Ql: NONREACTIVE

## 2015-06-08 ENCOUNTER — Ambulatory Visit (INDEPENDENT_AMBULATORY_CARE_PROVIDER_SITE_OTHER): Payer: BLUE CROSS/BLUE SHIELD

## 2015-06-08 DIAGNOSIS — R202 Paresthesia of skin: Secondary | ICD-10-CM | POA: Diagnosis not present

## 2015-06-09 MED ORDER — GADOPENTETATE DIMEGLUMINE 469.01 MG/ML IV SOLN: 17.0000 mL | Freq: Once | INTRAVENOUS | Status: DC | PRN

## 2015-06-10 ENCOUNTER — Telehealth: Payer: Self-pay | Admitting: Neurology

## 2015-06-10 DIAGNOSIS — R202 Paresthesia of skin: Secondary | ICD-10-CM

## 2015-06-10 NOTE — Telephone Encounter (Signed)
I called the patient. MRI the thoracic spine is normal, we will check MRI the brain to exclude demyelinating disease. He continues to have sensory complaints in the right lower quadrant of the abdomen, and into the groin area and down the legs. If anything, these complaints have worsened. Blood work was unremarkable exception of a minimally elevated sedimentation rate and an elevated rheumatoid factor. I'm not sure what the clinical significance of this is.   MRI thoracic 06/09/15:  IMPRESSION:  Normal MRI thoracic spine (with and without).

## 2015-06-16 ENCOUNTER — Encounter: Payer: Self-pay | Admitting: Neurology

## 2015-06-22 ENCOUNTER — Ambulatory Visit (INDEPENDENT_AMBULATORY_CARE_PROVIDER_SITE_OTHER): Payer: BLUE CROSS/BLUE SHIELD

## 2015-06-22 DIAGNOSIS — R202 Paresthesia of skin: Secondary | ICD-10-CM

## 2015-06-25 ENCOUNTER — Telehealth: Payer: Self-pay | Admitting: Neurology

## 2015-06-25 DIAGNOSIS — R202 Paresthesia of skin: Secondary | ICD-10-CM

## 2015-06-25 NOTE — Telephone Encounter (Signed)
I called the patient. The MRI of the head does not show evidence of MS, the patient claims now that all his symptoms are in the right leg, and that he does not have numbness up to the umbilicus on the right, we will check the low back.   MRI brain 06/24/15:  IMPRESSION: This is a normal MRI of the brain with and without contrast.

## 2015-06-27 ENCOUNTER — Telehealth: Payer: Self-pay | Admitting: Neurology

## 2015-06-27 NOTE — Telephone Encounter (Signed)
Patient called [facility] for [facility]. Please call [phone].

## 2015-06-28 NOTE — Telephone Encounter (Signed)
Returned patients call. Left a VM asking him to call me back.

## 2015-06-29 ENCOUNTER — Ambulatory Visit (INDEPENDENT_AMBULATORY_CARE_PROVIDER_SITE_OTHER): Payer: BLUE CROSS/BLUE SHIELD

## 2015-06-29 DIAGNOSIS — R202 Paresthesia of skin: Secondary | ICD-10-CM

## 2015-07-03 ENCOUNTER — Telehealth: Payer: Self-pay | Admitting: Neurology

## 2015-07-03 NOTE — Telephone Encounter (Signed)
I called patient. The MRI of the low back was normal. He has had MRI evaluation of the thoracic spine and brain that did not show evidence of demyelinating disease. Blood work that showed a minimally elevated rheumatoid factor, very slightly elevated sedimentation rate, I do not think that these findings are significant. The patient continues to complain of right testicular pain, he has had some paresthesias down the right leg as well, no definite neurologic explanation has been found so far. We will follow conservatively, the patient has changes in his condition, he is to contact our office.   MRI lumbar 07/02/15:  IMPRESSION:  Unremarkable MRI lumbar spine (without). No spinal stenosis or foraminal narrowing.

## 2016-07-17 DIAGNOSIS — M62838 Other muscle spasm: Secondary | ICD-10-CM | POA: Diagnosis not present

## 2016-07-17 DIAGNOSIS — M6281 Muscle weakness (generalized): Secondary | ICD-10-CM | POA: Diagnosis not present

## 2016-07-17 DIAGNOSIS — N50811 Right testicular pain: Secondary | ICD-10-CM | POA: Diagnosis not present

## 2016-07-17 DIAGNOSIS — R102 Pelvic and perineal pain: Secondary | ICD-10-CM | POA: Diagnosis not present

## 2016-08-08 DIAGNOSIS — M6281 Muscle weakness (generalized): Secondary | ICD-10-CM | POA: Diagnosis not present

## 2016-08-08 DIAGNOSIS — R1030 Lower abdominal pain, unspecified: Secondary | ICD-10-CM | POA: Diagnosis not present

## 2016-08-08 DIAGNOSIS — M62838 Other muscle spasm: Secondary | ICD-10-CM | POA: Diagnosis not present

## 2016-08-30 DIAGNOSIS — R1030 Lower abdominal pain, unspecified: Secondary | ICD-10-CM | POA: Diagnosis not present

## 2016-08-30 DIAGNOSIS — M6281 Muscle weakness (generalized): Secondary | ICD-10-CM | POA: Diagnosis not present

## 2016-08-30 DIAGNOSIS — M62838 Other muscle spasm: Secondary | ICD-10-CM | POA: Diagnosis not present

## 2016-08-30 DIAGNOSIS — N50811 Right testicular pain: Secondary | ICD-10-CM | POA: Diagnosis not present

## 2016-10-11 DIAGNOSIS — M6281 Muscle weakness (generalized): Secondary | ICD-10-CM | POA: Diagnosis not present

## 2016-10-11 DIAGNOSIS — M62838 Other muscle spasm: Secondary | ICD-10-CM | POA: Diagnosis not present

## 2016-10-11 DIAGNOSIS — R1030 Lower abdominal pain, unspecified: Secondary | ICD-10-CM | POA: Diagnosis not present

## 2016-10-11 DIAGNOSIS — R102 Pelvic and perineal pain: Secondary | ICD-10-CM | POA: Diagnosis not present

## 2018-07-01 ENCOUNTER — Encounter: Payer: Self-pay | Admitting: Family Medicine

## 2018-07-01 ENCOUNTER — Ambulatory Visit (INDEPENDENT_AMBULATORY_CARE_PROVIDER_SITE_OTHER): Payer: BC Managed Care – PPO | Admitting: Family Medicine

## 2018-07-01 ENCOUNTER — Other Ambulatory Visit: Payer: Self-pay

## 2018-07-01 VITALS — BP 132/88 | HR 92 | Temp 98.7°F | Ht 67.0 in | Wt 175.4 lb

## 2018-07-01 DIAGNOSIS — Z13220 Encounter for screening for lipoid disorders: Secondary | ICD-10-CM

## 2018-07-01 DIAGNOSIS — Z131 Encounter for screening for diabetes mellitus: Secondary | ICD-10-CM

## 2018-07-01 DIAGNOSIS — R5381 Other malaise: Secondary | ICD-10-CM

## 2018-07-01 DIAGNOSIS — Z6827 Body mass index (BMI) 27.0-27.9, adult: Secondary | ICD-10-CM

## 2018-07-01 DIAGNOSIS — Z0001 Encounter for general adult medical examination with abnormal findings: Secondary | ICD-10-CM | POA: Diagnosis not present

## 2018-07-01 LAB — CBC
HCT: 45.7 % (ref 39.0–52.0)
Hemoglobin: 15.1 g/dL (ref 13.0–17.0)
MCHC: 33.2 g/dL (ref 30.0–36.0)
MCV: 85.3 fl (ref 78.0–100.0)
Platelets: 298 10*3/uL (ref 150.0–400.0)
RBC: 5.35 Mil/uL (ref 4.22–5.81)
RDW: 13.8 % (ref 11.5–15.5)
WBC: 7.6 10*3/uL (ref 4.0–10.5)

## 2018-07-01 LAB — LDL CHOLESTEROL, DIRECT: Direct LDL: 113 mg/dL

## 2018-07-01 LAB — COMPREHENSIVE METABOLIC PANEL
ALT: 18 U/L (ref 0–53)
AST: 16 U/L (ref 0–37)
Albumin: 4.6 g/dL (ref 3.5–5.2)
Alkaline Phosphatase: 54 U/L (ref 39–117)
BUN: 11 mg/dL (ref 6–23)
CO2: 28 mEq/L (ref 19–32)
Calcium: 9.8 mg/dL (ref 8.4–10.5)
Chloride: 99 mEq/L (ref 96–112)
Creatinine, Ser: 0.95 mg/dL (ref 0.40–1.50)
GFR: 87.27 mL/min (ref >60.00)
Glucose, Bld: 78 mg/dL (ref 70–99)
Potassium: 4.1 mEq/L (ref 3.5–5.1)
Sodium: 137 mEq/L (ref 135–145)
Total Bilirubin: 0.8 mg/dL (ref 0.2–1.2)
Total Protein: 7.2 g/dL (ref 6.0–8.3)

## 2018-07-01 LAB — TSH: TSH: 2.66 u[IU]/mL (ref 0.35–4.50)

## 2018-07-01 LAB — LIPID PANEL
Cholesterol: 295 mg/dL — ABNORMAL HIGH (ref 0–200)
HDL: 40.1 mg/dL (ref >39.00)
Total CHOL/HDL Ratio: 7
Triglycerides: 972 mg/dL — ABNORMAL HIGH (ref 0.0–149.0)

## 2018-07-01 LAB — HEMOGLOBIN A1C: Hgb A1c MFr Bld: 6.1 % (ref 4.6–6.5)

## 2018-07-01 NOTE — Progress Notes (Signed)
Chief Complaint:  Alexander Aguirre is a 43 y.o. male who presents today for his annual comprehensive physical exam and to establish care.    Assessment/Plan:  Malaise Unclear etiology.  Possibly due to seasonal allergies.  Will check CBC, C met, and TSH. Consider trial of antihistamines if no abnormalities on lab work.  Hyperglycemia Check A1c.  Possible COVID-19 exposure Check serology.  BMI 27 Discussed lifestyle modification  Preventative Healthcare: Check lipid panel.  Patient Counseling(The following topics were reviewed and/or handout was given):  -Nutrition: Stressed importance of moderation in sodium/caffeine intake, saturated fat and cholesterol, caloric balance, sufficient intake of fresh fruits, vegetables, and fiber.  -Stressed the importance of regular exercise.   -Substance Abuse: Discussed cessation/primary prevention of tobacco, alcohol, or other drug use; driving or other dangerous activities under the influence; availability of treatment for abuse.   -Injury prevention: Discussed safety belts, safety helmets, smoke detector, smoking near bedding or upholstery.   -Sexuality: Discussed sexually transmitted diseases, partner selection, use of condoms, avoidance of unintended pregnancy and contraceptive alternatives.   -Dental health: Discussed importance of regular tooth brushing, flossing, and dental visits.  -Health maintenance and immunizations reviewed. Please refer to Health maintenance section.  Return to care in 1 year for next preventative visit.     Subjective:  HPI:  He has no acute complaints today.   Few years ago patient had issues with right lower quadrant abdominal pain.  Underwent work-up at that time including colonoscopy and endoscopy.  Ultimately no abnormalities were found.  He was then referred to urology due to complaints of penile pain.  Urology did not find any prostate issues however did note that he had some pelvic floor dysfunction.  He has  been working with physical therapy for this and the symptoms have improved however he still has some lingering pain in his right hip.  He has also had ongoing issues with recurrent malaise during the late spring and early summer months.  He is not sure if this is related to allergies or some other recurrent infection.  Currently feels okay.  He also had a cough a few months ago in February to being around his parents who just returned from [place].  Is concerned that he may have contracted COVID-19 at that time.  Does not currently have any sort of cough, fevers, or chills.  Lifestyle Diet: No specific diets or eating plans.  Exercise: Nothing currently due to COVID. Trying to get back into it.   No flowsheet data found.  Health Maintenance Due  Topic Date Due  . TETANUS/TDAP  04/05/1996     ROS: Per HPI, otherwise a complete review of systems was negative.   PMH:  The following were reviewed and entered/updated in epic: History reviewed. No pertinent past medical history. Patient Active Problem List   Diagnosis Date Noted  . Paresthesia 06/01/2015   Past Surgical History:  Procedure Laterality Date  . COLONOSCOPY  12/09/14   in [place]  . COLONOSCOPY  01/28/2015   SLF: 1. No source for abdominal pain identified 2. Normal ileum, colon and rectum  . COLONOSCOPY N/A 01/28/2015   Procedure: COLONOSCOPY;  Surgeon: Danie Binder, MD;  [facility];  Service: Endoscopy;  Laterality: N/A;  . ESOPHAGOGASTRODUODENOSCOPY  01/28/2015   SLF: 1. No source fir abdominal pain identified  . ESOPHAGOGASTRODUODENOSCOPY N/A 01/28/2015   Procedure: ESOPHAGOGASTRODUODENOSCOPY (EGD);  Surgeon: Danie Binder, MD;  [facility];  Service: Endoscopy;  Laterality:  N/A;  0730  . GIVENS CAPSULE STUDY N/A 12/31/2014   Procedure: GIVENS CAPSULE STUDY;  Surgeon: Danie Binder, MD;  [facility];  Service: Endoscopy;  Laterality: N/A;  0700  . REFRACTIVE SURGERY       Family History  Problem Relation Age of Onset  . Colon polyps Father   . Diabetes Father   . High Cholesterol Father   . High Cholesterol Mother   . Colon cancer Neg Hx     Medications- reviewed and updated Current Outpatient Medications  Medication Sig Dispense Refill  . Bioflavonoid Products (ESTER C PO) Take by mouth.    . Multiple Vitamins-Minerals (ALIVE MENS ENERGY PO) Take by mouth.    . Probiotic Product (PROBIOTIC ADVANCED PO) Take by mouth.     No current facility-administered medications for this visit.     Allergies-reviewed and updated No Known Allergies  Social History   Socioeconomic History  . Marital status: Single    Spouse name: Not on file  . Number of children: 0  . Years of education: 98  . Highest education level: Not on file  Occupational History  . Occupation: [employer]  Social Needs  . Financial resource strain: Not on file  . Food insecurity    Worry: Not on file    Inability: Not on file  . Transportation needs    Medical: Not on file    Non-medical: Not on file  Tobacco Use  . Smoking status: Never Smoker  . Smokeless tobacco: Never Used  Substance and Sexual Activity  . Alcohol use: No    Alcohol/week: 0.0 standard drinks    Comment: 3-4 drinks in a week, none in the past 6 mos  . Drug use: No  . Sexual activity: Not on file  Lifestyle  . Physical activity    Days per week: Not on file    Minutes per session: Not on file  . Stress: Not on file  Relationships  . Social Herbalist on phone: Not on file    Gets together: Not on file    Attends religious service: Not on file    Active member of club or organization: Not on file    Attends meetings of clubs or organizations: Not on file    Relationship status: Not on file  Other Topics Concern  . Not on file  Social History Narrative   Lives at home w/ his parents   Right-handed   No caffeine use recently        Objective:  Physical Exam: BP 132/88 (BP  Location: Left Arm, Patient Position: Sitting, Cuff Size: Normal)   Pulse 92   Temp 98.7 F (37.1 C) (Oral)   Ht _0  (1.702 m)   Wt 175 lb 6.4 oz (79.6 kg)   SpO2 98%   BMI 27.47 kg/m   Body mass index is 27.47 kg/m. Wt Readings from Last 3 Encounters:  07/01/18 175 lb 6.4 oz (79.6 kg)  06/01/15 176 lb (79.8 kg)  01/28/15 165 lb (74.8 kg)   Gen: NAD, resting comfortably HEENT: TMs normal bilaterally. OP clear. No thyromegaly noted.  CV: RRR with no murmurs appreciated Pulm: NWOB, CTAB with no crackles, wheezes, or rhonchi GI: Normal bowel sounds present. Soft, Nontender, Nondistended. MSK: no edema, cyanosis, or clubbing noted Skin: warm, dry Neuro: CN2-12 grossly intact. Strength 5/5 in upper and lower extremities. Reflexes symmetric and intact bilaterally.  Psych: Normal affect and thought content  Algis Greenhouse. Jerline Pain, MD 07/01/2018 9:32 AM

## 2018-07-01 NOTE — Patient Instructions (Signed)
It was very nice to see you today!  We will check blood work today.    Eat at least 3 REAL meals and 1-2 snacks per day.  Aim for no more than 5 hours between eating.  Eat breakfast within one hour of getting up.    Obtain twice as many fruits/vegetables as protein or carbohydrate foods for both lunch and dinner.   Cut down on sweet beverages. This includes juice, soda, and sweet tea.    Exercise at least 150 minutes every week.   Take care, Dr Jerline Pain   Preventive Care 40-64 Years, Male Preventive care refers to lifestyle choices and visits with your health care provider that can promote health and wellness. What does preventive care include?   A yearly physical exam. This is also called an annual well check.  Dental exams once or twice a year.  Routine eye exams. Ask your health care provider how often you should have your eyes checked.  Personal lifestyle choices, including: ? Daily care of your teeth and gums. ? Regular physical activity. ? Eating a healthy diet. ? Avoiding tobacco and drug use. ? Limiting alcohol use. ? Practicing safe sex. ? Taking low-dose aspirin every day starting at age 51. What happens during an annual well check? The services and screenings done by your health care provider during your annual well check will depend on your age, overall health, lifestyle risk factors, and family history of disease. Counseling Your health care provider may ask you questions about your:  Alcohol use.  Tobacco use.  Drug use.  Emotional well-being.  Home and relationship well-being.  Sexual activity.  Eating habits.  Work and work Statistician. Screening You may have the following tests or measurements:  Height, weight, and BMI.  Blood pressure.  Lipid and cholesterol levels. These may be checked every 5 years, or more frequently if you are over 60 years old.  Skin check.  Lung cancer screening. You may have this screening every year  starting at age 39 if you have a 30-pack-year history of smoking and currently smoke or have quit within the past 15 years.  Colorectal cancer screening. All adults should have this screening starting at age 42 and continuing until age 43. Your health care provider may recommend screening at age 54. You will have tests every 1-10 years, depending on your results and the type of screening test. People at increased risk should start screening at an earlier age. Screening tests may include: ? Guaiac-based fecal occult blood testing. ? Fecal immunochemical test (FIT). ? Stool DNA test. ? Virtual colonoscopy. ? Sigmoidoscopy. During this test, a flexible tube with a tiny camera (sigmoidoscope) is used to examine your rectum and lower colon. The sigmoidoscope is inserted through your anus into your rectum and lower colon. ? Colonoscopy. During this test, a long, thin, flexible tube with a tiny camera (colonoscope) is used to examine your entire colon and rectum.  Prostate cancer screening. Recommendations will vary depending on your family history and other risks.  Hepatitis C blood test.  Hepatitis B blood test.  Sexually transmitted disease (STD) testing.  Diabetes screening. This is done by checking your blood sugar (glucose) after you have not eaten for a while (fasting). You may have this done every 1-3 years. Discuss your test results, treatment options, and if necessary, the need for more tests with your health care provider. Vaccines Your health care provider may recommend certain vaccines, such as:  Influenza vaccine. This is recommended every  year.  Tetanus, diphtheria, and acellular pertussis (Tdap, Td) vaccine. You may need a Td booster every 10 years.  Varicella vaccine. You may need this if you have not been vaccinated.  Zoster vaccine. You may need this after age 32.  Measles, mumps, and rubella (MMR) vaccine. You may need at least one dose of MMR if you were born in 1957 or  later. You may also need a second dose.  Pneumococcal 13-valent conjugate (PCV13) vaccine. You may need this if you have certain conditions and have not been vaccinated.  Pneumococcal polysaccharide (PPSV23) vaccine. You may need one or two doses if you smoke cigarettes or if you have certain conditions.  Meningococcal vaccine. You may need this if you have certain conditions.  Hepatitis A vaccine. You may need this if you have certain conditions or if you travel or work in places where you may be exposed to hepatitis A.  Hepatitis B vaccine. You may need this if you have certain conditions or if you travel or work in places where you may be exposed to hepatitis B.  Haemophilus influenzae type b (Hib) vaccine. You may need this if you have certain risk factors. Talk to your health care provider about which screenings and vaccines you need and how often you need them. This information is not intended to replace advice given to you by your health care provider. Make sure you discuss any questions you have with your health care provider. Document Released: 01/28/2015 Document Revised: 02/21/2017 Document Reviewed: 11/02/2014 Elsevier Interactive Patient Education  2019 [employer].

## 2018-07-02 LAB — SAR COV2 SEROLOGY (COVID19)AB(IGG),IA: SARS CoV2 AB IGG: NEGATIVE

## 2018-07-03 NOTE — Progress Notes (Signed)
Please inform patient of the following:  Triglycerides are very high. Please ask him to come back for a fasting lab to recheck. Please place future order for lipid panel.   Blood sugar was borderline. Do not need to start meds, but he should work on diet and exercise and we can recheck next year.  COVID antibody test is negative.   All of his other labs are NORMAL.  Alexander Aguirre. Jerline Pain, MD 07/03/2018 11:54 AM

## 2018-07-04 ENCOUNTER — Other Ambulatory Visit: Payer: BC Managed Care – PPO

## 2018-07-04 ENCOUNTER — Other Ambulatory Visit: Payer: Self-pay

## 2018-07-04 DIAGNOSIS — Z0189 Encounter for other specified special examinations: Secondary | ICD-10-CM

## 2018-07-04 DIAGNOSIS — Z13220 Encounter for screening for lipoid disorders: Secondary | ICD-10-CM

## 2018-07-07 ENCOUNTER — Encounter: Payer: Self-pay | Admitting: Family Medicine

## 2018-07-07 ENCOUNTER — Other Ambulatory Visit: Payer: Self-pay

## 2018-07-07 ENCOUNTER — Other Ambulatory Visit (INDEPENDENT_AMBULATORY_CARE_PROVIDER_SITE_OTHER): Payer: BC Managed Care – PPO

## 2018-07-07 DIAGNOSIS — Z13220 Encounter for screening for lipoid disorders: Secondary | ICD-10-CM

## 2018-07-07 DIAGNOSIS — E785 Hyperlipidemia, unspecified: Secondary | ICD-10-CM | POA: Insufficient documentation

## 2018-07-07 LAB — LIPID PANEL
Cholesterol: 280 mg/dL — ABNORMAL HIGH (ref 0–200)
HDL: 40.1 mg/dL (ref >39.00)
Total CHOL/HDL Ratio: 7
Triglycerides: 519 mg/dL — ABNORMAL HIGH (ref 0.0–149.0)

## 2018-07-07 LAB — LDL CHOLESTEROL, DIRECT: Direct LDL: 155 mg/dL

## 2018-07-07 NOTE — Progress Notes (Signed)
Please inform patient of the following:  His triglycerides are still high, but at an acceptable range compared to last week. Would like for him to continue working on diet and exercise and we can recheck in 6 months. Please place future orders for hemoglobin A1c and lipid panel.   Alexander Aguirre. Jerline Pain, MD 07/07/2018 4:05 PM

## 2018-07-09 ENCOUNTER — Other Ambulatory Visit: Payer: Self-pay

## 2018-07-09 DIAGNOSIS — E785 Hyperlipidemia, unspecified: Secondary | ICD-10-CM

## 2018-07-09 DIAGNOSIS — Z131 Encounter for screening for diabetes mellitus: Secondary | ICD-10-CM

## 2018-08-15 DIAGNOSIS — Z20828 Contact with and (suspected) exposure to other viral communicable diseases: Secondary | ICD-10-CM | POA: Diagnosis not present

## 2018-10-22 ENCOUNTER — Ambulatory Visit: Payer: BC Managed Care – PPO

## 2018-12-26 ENCOUNTER — Other Ambulatory Visit: Payer: Self-pay

## 2018-12-29 ENCOUNTER — Other Ambulatory Visit: Payer: Self-pay

## 2018-12-29 ENCOUNTER — Other Ambulatory Visit (INDEPENDENT_AMBULATORY_CARE_PROVIDER_SITE_OTHER): Payer: BC Managed Care – PPO

## 2018-12-29 DIAGNOSIS — Z131 Encounter for screening for diabetes mellitus: Secondary | ICD-10-CM | POA: Diagnosis not present

## 2018-12-29 DIAGNOSIS — E785 Hyperlipidemia, unspecified: Secondary | ICD-10-CM | POA: Diagnosis not present

## 2018-12-29 LAB — LIPID PANEL
Cholesterol: 250 mg/dL — ABNORMAL HIGH (ref 0–200)
HDL: 45 mg/dL (ref >39.00)
NonHDL: 205.38
Total CHOL/HDL Ratio: 6
Triglycerides: 297 mg/dL — ABNORMAL HIGH (ref 0.0–149.0)
VLDL: 59.4 mg/dL — ABNORMAL HIGH (ref 0.0–40.0)

## 2018-12-29 LAB — LDL CHOLESTEROL, DIRECT: Direct LDL: 148 mg/dL

## 2018-12-29 LAB — HEMOGLOBIN A1C: Hgb A1c MFr Bld: 6 % (ref 4.6–6.5)

## 2018-12-31 ENCOUNTER — Other Ambulatory Visit: Payer: Self-pay

## 2019-01-01 ENCOUNTER — Encounter: Payer: Self-pay | Admitting: Family Medicine

## 2019-01-01 ENCOUNTER — Ambulatory Visit (INDEPENDENT_AMBULATORY_CARE_PROVIDER_SITE_OTHER): Payer: BC Managed Care – PPO | Admitting: Family Medicine

## 2019-01-01 VITALS — BP 124/78 | HR 87 | Temp 97.7°F | Ht 67.0 in | Wt 156.2 lb

## 2019-01-01 DIAGNOSIS — E785 Hyperlipidemia, unspecified: Secondary | ICD-10-CM

## 2019-01-01 DIAGNOSIS — M25562 Pain in left knee: Secondary | ICD-10-CM

## 2019-01-01 DIAGNOSIS — R739 Hyperglycemia, unspecified: Secondary | ICD-10-CM | POA: Diagnosis not present

## 2019-01-01 MED ORDER — MELOXICAM 15 MG PO TABS: 15.0000 mg | ORAL_TABLET | Freq: Every day | ORAL | 0 refills | Status: DC

## 2019-01-01 NOTE — Progress Notes (Addendum)
   Chief Complaint:  Alexander Aguirre is a 43 y.o. male who presents today with a chief complaint of knee pain.   Assessment/Plan:  Knee Pain Consistent with patellar tendonitis. Start meloxicam.  Discussed home exercises and handout was given.  Can continue using compression and ice as needed.  If no improvement would consider referral to PT and/or sports med.  Dyslipidemia Triglycerides improving. Continue lifestyle modifications.  Check lipid panel again in 6 months.  Hyperglycemia A1c improving. Continue lifestyle modifications.  Patient has lost 20 pounds in the last several months.  Congratulated him on hard work.  Recheck A1c next blood draw.     Subjective:  HPI:  Knee Pain  Started 2 months ago.  Located in left knee.  Had recently started jumping rope however felt a pop while walking.  Located on the front of his knee.  Pain has been persistent since then however is modestly improved over last few weeks.  Is not tried any activity since injuring it a couple of months ago.  He has tried using ice with modest improvement.  Also tried using a compression band with no apparent effect.  No other treatments tried.  # Dyslipidemia - Working on lifestyle modifications  # Hyperglycemia - Working on lifestyle modifications  ROS: Per HPI  PMH: He reports that he has never smoked. He has never used smokeless tobacco. He reports that he does not drink alcohol or use drugs.      Objective:  Physical Exam: BP 124/78   Pulse 87   Temp 97.7 F (36.5 C)   Ht 5\' 7"  (1.702 m)   Wt 156 lb 3.2 oz (70.9 kg)   SpO2 99%   BMI 24.46 kg/m   Wt Readings from Last 3 Encounters:  01/01/19 156 lb 3.2 oz (70.9 kg)  07/01/18 175 lb 6.4 oz (79.6 kg)  06/01/15 176 lb (79.8 kg)  Gen: NAD, resting comfortably CV: Regular rate and rhythm with no murmurs appreciated Pulm: Normal work of breathing, clear to auscultation bilaterally with no crackles, wheezes, or rhonchi MSK: No edema, cyanosis, or  clubbing noted - Knee: Left knee with no apparent deformities.  Mildly tender to palpation along patellar tendon.  No joint line tenderness.  Full range of motion.  Slight pain elicited with extension.  Some pain with resisted extension as well.  Stable anterior and posterior drawer signs. Skin: Warm, dry Neuro: Grossly normal, moves all extremities Psych: Normal affect and thought content      Arushi Partridge M. Jerline Pain, MD 01/01/2019 10:13 AM

## 2019-01-01 NOTE — Assessment & Plan Note (Addendum)
Triglycerides improving. Continue lifestyle modifications.  Check lipid panel again in 6 months.

## 2019-01-01 NOTE — Patient Instructions (Signed)
It was very nice to see you today!  Please work on exercises.  Please take the meloxicam daily for the next 1 to 2 weeks.  Keep up the good work with your diet and exercise.  I will see you back in 6 months for your annual checkup.  Come back sooner if needed.   Take care, Dr Jerline Pain  Please try these tips to maintain a healthy lifestyle:   Eat at least 3 REAL meals and 1-2 snacks per day.  Aim for no more than 5 hours between eating.  If you eat breakfast, please do so within one hour of getting up.    Each meal should contain half fruits/vegetables, one quarter protein, and one quarter carbs (no bigger than a computer mouse)   Cut down on sweet beverages. This includes juice, soda, and sweet tea.     Drink at least 1 glass of water with each meal and aim for at least 8 glasses per day   Exercise at least 150 minutes every week.

## 2019-01-01 NOTE — Assessment & Plan Note (Addendum)
A1c improving. Continue lifestyle modifications.  Patient has lost 20 pounds in the last several months.  Congratulated him on hard work.  Recheck A1c next blood draw.

## 2019-07-03 ENCOUNTER — Other Ambulatory Visit: Payer: Self-pay

## 2019-07-03 ENCOUNTER — Other Ambulatory Visit (INDEPENDENT_AMBULATORY_CARE_PROVIDER_SITE_OTHER): Payer: BC Managed Care – PPO

## 2019-07-03 DIAGNOSIS — E785 Hyperlipidemia, unspecified: Secondary | ICD-10-CM | POA: Diagnosis not present

## 2019-07-03 DIAGNOSIS — R739 Hyperglycemia, unspecified: Secondary | ICD-10-CM

## 2019-07-03 LAB — COMPREHENSIVE METABOLIC PANEL
ALT: 18 U/L (ref 0–53)
AST: 26 U/L (ref 0–37)
Albumin: 4.6 g/dL (ref 3.5–5.2)
Alkaline Phosphatase: 50 U/L (ref 39–117)
BUN: 13 mg/dL (ref 6–23)
CO2: 27 mEq/L (ref 19–32)
Calcium: 9.7 mg/dL (ref 8.4–10.5)
Chloride: 103 mEq/L (ref 96–112)
Creatinine, Ser: 1.1 mg/dL (ref 0.40–1.50)
GFR: 73.32 mL/min (ref >60.00)
Glucose, Bld: 84 mg/dL (ref 70–99)
Potassium: 4.3 mEq/L (ref 3.5–5.1)
Sodium: 137 mEq/L (ref 135–145)
Total Bilirubin: 1.1 mg/dL (ref 0.2–1.2)
Total Protein: 7.2 g/dL (ref 6.0–8.3)

## 2019-07-03 LAB — CBC
HCT: 43.6 % (ref 39.0–52.0)
Hemoglobin: 14.6 g/dL (ref 13.0–17.0)
MCHC: 33.5 g/dL (ref 30.0–36.0)
MCV: 83.8 fl (ref 78.0–100.0)
Platelets: 290 10*3/uL (ref 150.0–400.0)
RBC: 5.2 Mil/uL (ref 4.22–5.81)
RDW: 13.7 % (ref 11.5–15.5)
WBC: 6 10*3/uL (ref 4.0–10.5)

## 2019-07-03 LAB — TSH: TSH: 2.23 u[IU]/mL (ref 0.35–4.50)

## 2019-07-03 LAB — LIPID PANEL
Cholesterol: 249 mg/dL — ABNORMAL HIGH (ref 0–200)
HDL: 43.5 mg/dL (ref >39.00)
LDL Cholesterol: 176 mg/dL — ABNORMAL HIGH (ref 0–99)
NonHDL: 205.91
Total CHOL/HDL Ratio: 6
Triglycerides: 151 mg/dL — ABNORMAL HIGH (ref 0.0–149.0)
VLDL: 30.2 mg/dL (ref 0.0–40.0)

## 2019-07-03 LAB — HEMOGLOBIN A1C: Hgb A1c MFr Bld: 6 % (ref 4.6–6.5)

## 2019-07-07 ENCOUNTER — Encounter: Payer: Self-pay | Admitting: Family Medicine

## 2019-07-07 ENCOUNTER — Other Ambulatory Visit: Payer: Self-pay

## 2019-07-07 ENCOUNTER — Ambulatory Visit (INDEPENDENT_AMBULATORY_CARE_PROVIDER_SITE_OTHER): Payer: BC Managed Care – PPO | Admitting: Family Medicine

## 2019-07-07 VITALS — BP 110/70 | HR 77 | Temp 97.9°F | Ht 67.0 in | Wt 161.4 lb

## 2019-07-07 DIAGNOSIS — R739 Hyperglycemia, unspecified: Secondary | ICD-10-CM | POA: Diagnosis not present

## 2019-07-07 DIAGNOSIS — E785 Hyperlipidemia, unspecified: Secondary | ICD-10-CM | POA: Diagnosis not present

## 2019-07-07 DIAGNOSIS — Z0001 Encounter for general adult medical examination with abnormal findings: Secondary | ICD-10-CM | POA: Diagnosis not present

## 2019-07-07 DIAGNOSIS — Z6825 Body mass index (BMI) 25.0-25.9, adult: Secondary | ICD-10-CM

## 2019-07-07 DIAGNOSIS — M25562 Pain in left knee: Secondary | ICD-10-CM | POA: Diagnosis not present

## 2019-07-07 DIAGNOSIS — M25551 Pain in right hip: Secondary | ICD-10-CM | POA: Insufficient documentation

## 2019-07-07 NOTE — Assessment & Plan Note (Signed)
10-year ASCVD 2%.  He will continue working on lifestyle modifications.  Recheck in a year.

## 2019-07-07 NOTE — Patient Instructions (Signed)
It was very nice to see you today!  Keep up the good work!  We will place referral for you to see sports medicine.  Please come back in 1 year for your next physical, or sooner if needed.  Take care, Dr Jerline Pain  Please try these tips to maintain a healthy lifestyle:   Eat at least 3 REAL meals and 1-2 snacks per day.  Aim for no more than 5 hours between eating.  If you eat breakfast, please do so within one hour of getting up.    Each meal should contain half fruits/vegetables, one quarter protein, and one quarter carbs (no bigger than a computer mouse)   Cut down on sweet beverages. This includes juice, soda, and sweet tea.     Drink at least 1 glass of water with each meal and aim for at least 8 glasses per day   Exercise at least 150 minutes every week.    Preventive Care 42-1 Years Old, Male Preventive care refers to lifestyle choices and visits with your health care provider that can promote health and wellness. This includes:  A yearly physical exam. This is also called an annual well check.  Regular dental and eye exams.  Immunizations.  Screening for certain conditions.  Healthy lifestyle choices, such as eating a healthy diet, getting regular exercise, not using drugs or products that contain nicotine and tobacco, and limiting alcohol use. What can I expect for my preventive care visit? Physical exam Your health care provider will check:  Height and weight. These may be used to calculate body mass index (BMI), which is a measurement that tells if you are at a healthy weight.  Heart rate and blood pressure.  Your skin for abnormal spots. Counseling Your health care provider may ask you questions about:  Alcohol, tobacco, and drug use.  Emotional well-being.  Home and relationship well-being.  Sexual activity.  Eating habits.  Work and work Statistician. What immunizations do I need?  Influenza (flu) vaccine  This is recommended every  year. Tetanus, diphtheria, and pertussis (Tdap) vaccine  You may need a Td booster every 10 years. Varicella (chickenpox) vaccine  You may need this vaccine if you have not already been vaccinated. Zoster (shingles) vaccine  You may need this after age 42. Measles, mumps, and rubella (MMR) vaccine  You may need at least one dose of MMR if you were born in 1957 or later. You may also need a second dose. Pneumococcal conjugate (PCV13) vaccine  You may need this if you have certain conditions and were not previously vaccinated. Pneumococcal polysaccharide (PPSV23) vaccine  You may need one or two doses if you smoke cigarettes or if you have certain conditions. Meningococcal conjugate (MenACWY) vaccine  You may need this if you have certain conditions. Hepatitis A vaccine  You may need this if you have certain conditions or if you travel or work in places where you may be exposed to hepatitis A. Hepatitis B vaccine  You may need this if you have certain conditions or if you travel or work in places where you may be exposed to hepatitis B. Haemophilus influenzae type b (Hib) vaccine  You may need this if you have certain risk factors. Human papillomavirus (HPV) vaccine  If recommended by your health care provider, you may need three doses over 6 months. You may receive vaccines as individual doses or as more than one vaccine together in one shot (combination vaccines). Talk with your health care provider about  the risks and benefits of combination vaccines. What tests do I need? Blood tests  Lipid and cholesterol levels. These may be checked every 5 years, or more frequently if you are over 21 years old.  Hepatitis C test.  Hepatitis B test. Screening  Lung cancer screening. You may have this screening every year starting at age 42 if you have a 30-pack-year history of smoking and currently smoke or have quit within the past 15 years.  Prostate cancer screening.  Recommendations will vary depending on your family history and other risks.  Colorectal cancer screening. All adults should have this screening starting at age 42 and continuing until age 42. Your health care provider may recommend screening at age 42 if you are at increased risk. You will have tests every 1-10 years, depending on your results and the type of screening test.  Diabetes screening. This is done by checking your blood sugar (glucose) after you have not eaten for a while (fasting). You may have this done every 1-3 years.  Sexually transmitted disease (STD) testing. Follow these instructions at home: Eating and drinking  Eat a diet that includes fresh fruits and vegetables, whole grains, lean protein, and low-fat dairy products.  Take vitamin and mineral supplements as recommended by your health care provider.  Do not drink alcohol if your health care provider tells you not to drink.  If you drink alcohol: ? Limit how much you have to 0-2 drinks a day. ? Be aware of how much alcohol is in your drink. In the [place] one drink equals one 12 oz bottle of beer (355 mL), one 5 oz glass of wine (148 mL), or one 1 oz glass of hard liquor (44 mL). Lifestyle  Take daily care of your teeth and gums.  Stay active. Exercise for at least 30 minutes on 5 or more days each week.  Do not use any products that contain nicotine or tobacco, such as cigarettes, e-cigarettes, and chewing tobacco. If you need help quitting, ask your health care provider.  If you are sexually active, practice safe sex. Use a condom or other form of protection to prevent STIs (sexually transmitted infections).  Talk with your health care provider about taking a low-dose aspirin every day starting at age 42. What's next?  Go to your health care provider once a year for a well check visit.  Ask your health care provider how often you should have your eyes and teeth checked.  Stay up to date on all vaccines. This  information is not intended to replace advice given to you by your health care provider. Make sure you discuss any questions you have with your health care provider. Document Revised: 12/26/2017 Document Reviewed: 12/26/2017 Elsevier Patient Education  2020 [employer].

## 2019-07-07 NOTE — Progress Notes (Signed)
Chief Complaint:  Alexander Aguirre is a 44 y.o. male who presents today for his annual comprehensive physical exam.    Assessment/Plan:  New/Acute Problems: Right hip Aguirre Chronic.  Still has quite a bit of issue.  Could be contributing to left knee Aguirre as well.  Will place referral to sports medicine.  Left Knee Aguirre No red flags.  Stable exam.  Possibly due to over compensation due to right hip Aguirre.  Will place referral to sports medicine as above.  Chronic Problems Addressed Today: Hyperglycemia A1c stable.  Continue lifestyle modifications.  Dyslipidemia 10-year ASCVD 2%.  He will continue working on lifestyle modifications.  Recheck in a year.   Body mass index is 25.28 kg/m. / Overweight  BMI Metric Follow Up - 07/07/19 0849      BMI Metric Follow Up-Please document annually   BMI Metric Follow Up Education provided            Preventative Healthcare: Colonoscopy in 2017 -repeat in 2027. UTD on other vaccines and screenings.   Patient Counseling(The following topics were reviewed and/or handout was given):  -Nutrition: Stressed importance of moderation in sodium/caffeine intake, saturated fat and cholesterol, caloric balance, sufficient intake of fresh fruits, vegetables, and fiber.  -Stressed the importance of regular exercise.   -Substance Abuse: Discussed cessation/primary prevention of tobacco, alcohol, or other drug use; driving or other dangerous activities under the influence; availability of treatment for abuse.   -Injury prevention: Discussed safety belts, safety helmets, smoke detector, smoking near bedding or upholstery.   -Sexuality: Discussed sexually transmitted diseases, partner selection, use of condoms, avoidance of unintended pregnancy and contraceptive alternatives.   -Dental health: Discussed importance of regular tooth brushing, flossing, and dental visits.  -Health maintenance and immunizations reviewed. Please refer to Health maintenance  section.  Return to care in 1 year for next preventative visit.     Subjective:  HPI:  He has no acute complaints today  He has had a slight recurrence of left knee Aguirre about a week ago.  Symptoms have improved today.  He rested over the last few days.  No other treatments tried.  He has also had chronic right hip Aguirre.  He has been seeing physical therapist for this which helps modestly.  Lifestyle Diet: Trying to eat more fruits and vegetables.  Exercise: Working on running and walking more. Trying to walk 5 miles per day. Goes to gym and works on [employer]   Depression screen PHQ 2/9 01/01/2019  Decreased Interest 0  Down, Depressed, Hopeless 0  PHQ - 2 Score 0    Health Maintenance Due  Topic Date Due  . Hepatitis C Screening  Never done     ROS: Per HPI, otherwise a complete review of systems was negative.   PMH:  The following were reviewed and entered/updated in epic: History reviewed. No pertinent past medical history. Patient Active Problem List   Diagnosis Date Noted  . Right hip Aguirre 07/07/2019  . Hyperglycemia 01/01/2019  . Dyslipidemia 07/07/2018   Past Surgical History:  Procedure Laterality Date  . COLONOSCOPY  12/09/14   in [place]  . COLONOSCOPY  01/28/2015   SLF: 1. No source for abdominal Aguirre identified 2. Normal ileum, colon and rectum  . COLONOSCOPY N/A 01/28/2015   Procedure: COLONOSCOPY;  Surgeon: West Bali, MD;  [facility]: [facility];  Service: Endoscopy;  Laterality: N/A;  . ESOPHAGOGASTRODUODENOSCOPY  01/28/2015   SLF: 1. No source fir abdominal Aguirre identified  .  ESOPHAGOGASTRODUODENOSCOPY N/A 01/28/2015   Procedure: ESOPHAGOGASTRODUODENOSCOPY (EGD);  Surgeon: West Bali, MD;  Location: [facility];  Service: Endoscopy;  Laterality: N/A;  0730  . GIVENS CAPSULE STUDY N/A 12/31/2014   Procedure: GIVENS CAPSULE STUDY;  Surgeon: West Bali, MD;  Location: [facility];  Service: Endoscopy;  Laterality: N/A;   0700  . REFRACTIVE SURGERY      Family History  Problem Relation Age of Onset  . Colon polyps Father   . Diabetes Father   . High Cholesterol Father   . High Cholesterol Mother   . Colon cancer Neg Hx     Medications- reviewed and updated Current Outpatient Medications  Medication Sig Dispense Refill  . Multiple Vitamins-Minerals (ALIVE MENS ENERGY PO) Take by mouth.    . Omega-3 Fatty Acids (FISH OIL PO) Take by mouth.    . Probiotic Product (PROBIOTIC ADVANCED PO) Take by mouth.    . meloxicam (MOBIC) 15 MG tablet Take 1 tablet (15 mg total) by mouth daily. (Patient not taking: Reported on 07/07/2019) 30 tablet 0   No current facility-administered medications for this visit.    Allergies-reviewed and updated No Known Allergies  Social History   Socioeconomic History  . Marital status: Single    Spouse name: Not on file  . Number of children: 0  . Years of education: 29  . Highest education level: Not on file  Occupational History  . Occupation: [employer]  Tobacco Use  . Smoking status: Never Smoker  . Smokeless tobacco: Never Used  Substance and Sexual Activity  . Alcohol use: No    Alcohol/week: 0.0 standard drinks    Comment: 3-4 drinks in a week, none in the past 6 mos  . Drug use: No  . Sexual activity: Not on file  Other Topics Concern  . Not on file  Social History Narrative   Lives at home w/ his parents   Right-handed   No caffeine use recently   Social Determinants of Corporate investment banker Strain:   . Difficulty of Paying Living Expenses:   Food Insecurity:   . Worried About Programme researcher, broadcasting/film/video in the Last Year:   . Barista in the Last Year:   Transportation Needs:   . Freight forwarder (Medical):   Marland Kitchen Lack of Transportation (Non-Medical):   Physical Activity:   . Days of Exercise per Week:   . Minutes of Exercise per Session:   Stress:   . Feeling of Stress :   Social Connections:   . Frequency of Communication with  Friends and Family:   . Frequency of Social Gatherings with Friends and Family:   . Attends Religious Services:   . Active Member of Clubs or Organizations:   . Attends Banker Meetings:   Marland Kitchen Marital Status:         Objective:  Physical Exam: BP 110/70   Pulse 77   Temp 97.9 F (36.6 C)   Ht 5\' 7"  (1.702 m)   Wt 161 lb 6.4 oz (73.2 kg)   SpO2 98%   BMI 25.28 kg/m   Body mass index is 25.28 kg/m. Wt Readings from Last 3 Encounters:  07/07/19 161 lb 6.4 oz (73.2 kg)  01/01/19 156 lb 3.2 oz (70.9 kg)  07/01/18 175 lb 6.4 oz (79.6 kg)   Gen: NAD, resting comfortably HEENT: TMs normal bilaterally. OP clear. No thyromegaly noted.  CV: RRR with no murmurs appreciated Pulm:  NWOB, CTAB with no crackles, wheezes, or rhonchi GI: Normal bowel sounds present. Soft, Nontender, Nondistended. MSK: no edema, cyanosis, or clubbing noted - Knee: No deformities.  Full range of motion throughout.  Nontender to palpation.  Neurovascular intact distally -Right hip: Full range of motion.  Aguirre elicited with internal rotation. Skin: warm, dry Neuro: CN2-12 grossly intact. Strength 5/5 in upper and lower extremities. Reflexes symmetric and intact bilaterally.  Psych: Normal affect and thought content     Alexander Keziah M. Jerline Pain, MD 07/07/2019 8:50 AM

## 2019-07-07 NOTE — Assessment & Plan Note (Signed)
A1c stable.  Continue lifestyle modifications.

## 2019-07-09 ENCOUNTER — Ambulatory Visit (INDEPENDENT_AMBULATORY_CARE_PROVIDER_SITE_OTHER): Payer: BC Managed Care – PPO | Admitting: Family Medicine

## 2019-07-09 ENCOUNTER — Ambulatory Visit (INDEPENDENT_AMBULATORY_CARE_PROVIDER_SITE_OTHER): Payer: BC Managed Care – PPO

## 2019-07-09 ENCOUNTER — Other Ambulatory Visit: Payer: Self-pay

## 2019-07-09 ENCOUNTER — Encounter: Payer: Self-pay | Admitting: Family Medicine

## 2019-07-09 ENCOUNTER — Ambulatory Visit: Payer: Self-pay

## 2019-07-09 VITALS — BP 122/82 | HR 81 | Ht 67.0 in | Wt 161.4 lb

## 2019-07-09 DIAGNOSIS — M25551 Pain in right hip: Secondary | ICD-10-CM

## 2019-07-09 DIAGNOSIS — M25562 Pain in left knee: Secondary | ICD-10-CM

## 2019-07-09 DIAGNOSIS — G8929 Other chronic pain: Secondary | ICD-10-CM

## 2019-07-09 NOTE — Progress Notes (Signed)
Subjective:    I'm seeing this patient as a consultation for:  Dr. Jerline Pain. Note will be routed back to referring provider/PCP.  CC: L knee and R hip pain  HPI: Pt is a 44 y/o male presenting w/ c/o R hip and L knee pain.  R hip pain: Chronic pain x years (Oct 2016) began as abdominal pain and had multiple colonoscopies.  Referred to neurology in May 2017.  Referred to PT that improved the abdominal pain but then R hip pain began.  Began seeing PT for his R hip and con't to see PT quarterly.  He gets dry needling.  He locates his pain to his R lateral and ant hip.  He does note low back pain. -Radiating pain: No -Mechanical symptoms: No -Aggravating factors: Nothing specific -Treatments tried: PT, dry needling, massage  L knee pain: Nov 01, 2018- felt a pop in his L ant knee after walking x 5 miles.  Pain has improved until last Thursday when his L knee started hurting again while walking -L knee swelling: no -L knee mechanical symptoms: yes -Aggravating factors: walking -Treatments tried: HEP as provided by PT;  Past medical history, Surgical history, Family history, Social history, Allergies, and medications have been entered into the medical record, reviewed.   Review of Systems: No new headache, visual changes, nausea, vomiting, diarrhea, constipation, dizziness, abdominal pain, skin rash, fevers, chills, night sweats, weight loss, swollen lymph nodes, body aches, joint swelling, muscle aches, chest pain, shortness of breath, mood changes, visual or auditory hallucinations.   Objective:    Vitals:   07/09/19 1323  BP: 122/82  Pulse: 81  SpO2: 98%   General: Well Developed, well nourished, and in no acute distress.  Neuro/Psych: Alert and oriented x3, extra-ocular muscles intact, able to move all 4 extremities, sensation grossly intact. Skin: Warm and dry, no rashes noted.  Respiratory: Not using accessory muscles, speaking in full sentences, trachea midline.    Cardiovascular: Pulses palpable, no extremity edema. Abdomen: Does not appear distended. MSK: Right hip normal. Normal motion. Minimally tender trochanter and ischial tuberosity.. Hip abduction strength is intact 5/5 abduction and external rotation. Normal gait.  Left knee: Normal-appearing Normal motion without crepitation. Not particularly tender to palpation however palpable squeak is present overlying anterior knee. Stable ligamentous exam. Negative Murray's test. Intact strength.   Lab and Radiology Results  X-ray images right hip and left knee obtained today personally and independently reviewed  Right hip: Sclerosis present in superior portion of acetabulum indicated degenerative change.  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  No acute fractures.  Left knee: Minimal degenerative changes.  Await formal radiology review  Impression and Recommendations:    Assessment and Plan: 44 y.o. male with persistent right lateral hip pain ongoing for about 4 years now.  Pain started when he had an episode of abdominal pain that eventually resolved with conventional physical therapy.  However he has been going to physical therapy for years now.  Although he has had improvement he still has persistent obnoxious pain.  Pain occurs with walking and at bedtime.  Based on x-ray changes concerning for impingement and degenerative changes we will proceed with MRI arthrogram of the hip to further evaluate cause of pain.  He could be feeling his interarticular pain will more laterally confounding his treatment.  Recheck following MRI arthrogram.  Additionally he notes left knee pain.  Pain likely prepatellar bursitis.  Plan for Voltaren gel.  Relative rest  and recheck if not improved.  PDMP not reviewed this encounter. Orders Placed This Encounter  Procedures   DG HIP UNILAT WITH PELVIS 2-3 VIEWS RIGHT    Standing Status:   Future     Number of Occurrences:   1    Standing Expiration Date:   07/08/2020    Order Specific Question:   Reason for Exam (SYMPTOM  OR DIAGNOSIS REQUIRED)    Answer:   eval right hip pain    Order Specific Question:   Preferred imaging location?    Answer:   [facility]    Order Specific Question:   Radiology Contrast Protocol - do NOT remove file path    Answer:   [email or web address]   DG Knee AP/LAT W/Sunrise Left    Standing Status:   Future    Number of Occurrences:   1    Standing Expiration Date:   07/08/2020    Order Specific Question:   Reason for Exam (SYMPTOM  OR DIAGNOSIS REQUIRED)    Answer:   eval left knee pain    Order Specific Question:   Preferred imaging location?    Answer:   [facility]    Order Specific Question:   Radiology Contrast Protocol - do NOT remove file path    Answer:   [email or web address]   No orders of the defined types were placed in this encounter.   Discussed warning signs or symptoms. Please see discharge instructions. Patient expresses understanding.   The above documentation has been reviewed and is accurate and complete Clementeen Graham, M.D.

## 2019-07-09 NOTE — Patient Instructions (Signed)
Thank you for coming in today. Plan for xray today.  Likely will proceed to hip MRI.  Recheck following MRI to review findings and plan for treatment.

## 2019-07-13 NOTE — Progress Notes (Signed)
X-ray right hip shows spurring at the right hip concerning for impingement.  MRI arthrogram already ordered.

## 2019-07-13 NOTE — Progress Notes (Signed)
X-ray left knee looks pretty normal to radiology

## 2019-07-27 ENCOUNTER — Ambulatory Visit (INDEPENDENT_AMBULATORY_CARE_PROVIDER_SITE_OTHER): Payer: BC Managed Care – PPO

## 2019-07-27 ENCOUNTER — Other Ambulatory Visit: Payer: Self-pay

## 2019-07-27 ENCOUNTER — Ambulatory Visit: Payer: BC Managed Care – PPO | Admitting: Sports Medicine

## 2019-07-27 ENCOUNTER — Ambulatory Visit (INDEPENDENT_AMBULATORY_CARE_PROVIDER_SITE_OTHER): Payer: BC Managed Care – PPO | Admitting: Sports Medicine

## 2019-07-27 DIAGNOSIS — M1611 Unilateral primary osteoarthritis, right hip: Secondary | ICD-10-CM | POA: Diagnosis not present

## 2019-07-27 DIAGNOSIS — M25551 Pain in right hip: Secondary | ICD-10-CM | POA: Diagnosis not present

## 2019-07-27 DIAGNOSIS — G8929 Other chronic pain: Secondary | ICD-10-CM | POA: Diagnosis not present

## 2019-07-27 DIAGNOSIS — M7061 Trochanteric bursitis, right hip: Secondary | ICD-10-CM | POA: Diagnosis not present

## 2019-07-27 MED ORDER — GADOBUTROL 1 MMOL/ML IV SOLN
1.0000 mL | Freq: Once | INTRAVENOUS | Status: AC | PRN
Start: 2019-07-27 — End: 2019-07-27
  Administered 2019-07-27: 1 mL via INTRAVENOUS

## 2019-07-27 NOTE — Progress Notes (Signed)
    Procedures performed today:    Procedure: Real-time Ultrasound Guided gadolinium contrast injection of right hip joint Device: Samsung HS60  Verbal informed consent obtained.  Time-out conducted.  Noted no overlying erythema, induration, or other signs of local infection.  Skin prepped in a sterile fashion.  Local anesthesia: Topical Ethyl chloride.  With sterile technique and under real time ultrasound guidance: I advanced a 22-gauge spinal needle to the femoral head/neck junction, contacted bone and then injected 1 cc Kenalog 40, 2 cc lidocaine, 2 cc bupivacaine, syringe switched and 0.1 cc gadolinium injected, syringe again switched and 10 cc sterile saline used to distend the joint. Joint visualized and capsule seen distending confirming intra-articular placement of contrast material and medication. Completed without difficulty  Advised to call if fevers/chills, erythema, induration, drainage, or persistent bleeding.  Images permanently stored and available for review in the ultrasound unit.  Impression: Technically successful ultrasound guided gadolinium contrast injection for MR arthrography.  Please see separate MR arthrogram report.   Independent interpretation of notes and tests performed by another provider:   None.  Brief History, Exam, Impression, and Recommendations:    Right hip pain Alexander Aguirre is a referral for chronic right hip pain, is concerning for hip osteoarthritis, impingement, or labral tear. Today I performed the injection for his hip arthrogram, further management per primary treating provider.    ___________________________________________ [name]. Benjamin Stain, M.D., ABFM., CAQSM. Primary Care and Sports Medicine [facility]  Adjunct Instructor of Family Medicine  [employer]

## 2019-07-27 NOTE — Assessment & Plan Note (Signed)
Alexander Aguirre is a referral for chronic right hip pain, is concerning for hip osteoarthritis, impingement, or labral tear. Today I performed the injection for his hip arthrogram, further management per primary treating provider.

## 2019-07-28 NOTE — Progress Notes (Signed)
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  Mild trochanteric bursitis and tendinitis present.  Schedule follow-up with me in the near future to review these findings in further detail and discuss treatment plan and options.

## 2019-07-31 ENCOUNTER — Other Ambulatory Visit: Payer: Self-pay

## 2019-07-31 ENCOUNTER — Encounter: Payer: Self-pay | Admitting: Family Medicine

## 2019-07-31 ENCOUNTER — Ambulatory Visit (INDEPENDENT_AMBULATORY_CARE_PROVIDER_SITE_OTHER): Payer: BC Managed Care – PPO | Admitting: Family Medicine

## 2019-07-31 VITALS — BP 148/98 | HR 77 | Ht 67.0 in | Wt 164.2 lb

## 2019-07-31 DIAGNOSIS — G8929 Other chronic pain: Secondary | ICD-10-CM

## 2019-07-31 DIAGNOSIS — M25562 Pain in left knee: Secondary | ICD-10-CM

## 2019-07-31 DIAGNOSIS — M25551 Pain in right hip: Secondary | ICD-10-CM | POA: Diagnosis not present

## 2019-07-31 NOTE — Progress Notes (Signed)
I, Christoper Fabian, LAT, ATC, am serving as scribe for Dr. Clementeen Graham.  Alexander Aguirre is a 44 y.o. male who presents to [facility] at [facility] today for f/u of R hip pain and R hip MRI review.  He was last seen by Dr. Denyse Aguirre on 07/09/19 w/ c/o chronic R hip pain and was referred for a R hip MRI w/ contrast.  Since his last visit, pt reports that his R hip pain remains about the same/slightly worse.  He has had intermittent hip pain for years and has had significant trials of physical therapy with little benefit.  Most recently he has been seeing Alexander Aguirre PT.  This location does not accept insurance and is paying out-of-pocket.  Diagnostic imaging: R hip MRI w/ contrast- 07/27/19; R hip XR- 07/09/19   Pertinent review of systems: No fevers or chills  Relevant historical information: Ileitis   Exam:  BP (!) 148/98 (BP Location: Right Arm, Patient Position: Sitting, Cuff Size: Normal)   Pulse 77   Ht 5\' 7"  (1.702 m)   Wt 164 lb 3.2 oz (74.5 kg)   SpO2 99%   BMI 25.72 kg/m  General: Well Developed, well nourished, and in no acute distress.   MSK: Right hip normal-appearing normal motion.  Not particular tender greater trochanter.  Intact strength.    Lab and Radiology Results EXAM: MRI OF THE RIGHT HIP WITH CONTRAST  TECHNIQUE: Multiplanar, multisequence MR imaging was performed following the administration of intravenous contrast.  CONTRAST:  80mL GADAVIST GADOBUTROL 1 MMOL/ML IV SOLN  COMPARISON:  None.  FINDINGS: Both hips are normally located. Minimal degenerative chondrosis bilaterally. No stress fracture or AVN. Normal hip morphology. No findings suspicious for femoroacetabular impingement.  The anterior superior labrum is degenerated and torn. There is a small amount of air in the joint from the injection. No loose bodies are identified otherwise.  There is mild bilateral Parry tendinosis without trochanteric bursitis. The hip and pelvic  musculature is otherwise unremarkable. No muscle tear or myositis.  No significant intrapelvic abnormalities are identified.  The pubic symphysis and SI joints are intact. No pelvic fractures or bone lesions.  IMPRESSION: 1. Minimal degenerative chondrosis bilaterally but no stress fracture or AVN. 2. Degenerated and torn right anterior superior labrum. 3. 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 4. No significant intrapelvic abnormalities.   Electronically Signed   By: 0m M.D.   On: 07/28/2019 09:09 I, 07/30/2019, personally (independently) visualized and performed the interpretation of the images attached in this note.     Assessment and Plan: 44 y.o. male with right hip pain.  Ongoing for years.  Majority of pain is more lateral and posterior there bite likely caused by the peritendinitis seen on MRI arthrogram.  He does have a labrum tear seen on MRI arthrogram but does not have a lot of anterior hip pain.  I do not think his pain is largely been generated by the labrum tear.  The ideal treatment for this condition is physical therapy unfortunately has had a pretty significant trial of it with little benefit.  I think is worthwhile having a second opinion with a different physical therapist.  Referred to 45 to see if he can help.  Check back in about a month if not better would try steroid injection.  Could also consider second surgical opinion with orthopedic surgeon specializing hip scopes.    Orders Placed This Encounter  Procedures  . Ambulatory referral to Physical  Therapy    Referral Priority:   Routine    Referral Type:   Physical Medicine    Referral Reason:   Specialty Services Required    Requested Specialty:   Physical Therapy    Number of Visits Requested:   1   No orders of the defined types were placed in this encounter.    Discussed warning signs or symptoms. Please see discharge instructions. Patient  expresses understanding.   The above documentation has been reviewed and is accurate and complete Clementeen Graham, M.D.

## 2019-07-31 NOTE — Patient Instructions (Addendum)
Thank you for coming in today.  Plan for PT covered by insurance.  If not better after limited trial let me know can do injection or refer to hip ortho specialist.

## 2019-09-02 DIAGNOSIS — M25551 Pain in right hip: Secondary | ICD-10-CM | POA: Diagnosis not present

## 2019-09-18 DIAGNOSIS — M25551 Pain in right hip: Secondary | ICD-10-CM | POA: Diagnosis not present

## 2019-09-28 DIAGNOSIS — M25551 Pain in right hip: Secondary | ICD-10-CM | POA: Diagnosis not present

## 2019-10-05 DIAGNOSIS — M25551 Pain in right hip: Secondary | ICD-10-CM | POA: Diagnosis not present

## 2019-10-12 DIAGNOSIS — M25551 Pain in right hip: Secondary | ICD-10-CM | POA: Diagnosis not present

## 2019-10-21 DIAGNOSIS — M25551 Pain in right hip: Secondary | ICD-10-CM | POA: Diagnosis not present

## 2019-10-22 ENCOUNTER — Other Ambulatory Visit: Payer: Self-pay

## 2019-10-22 ENCOUNTER — Ambulatory Visit (INDEPENDENT_AMBULATORY_CARE_PROVIDER_SITE_OTHER): Payer: BC Managed Care – PPO

## 2019-10-22 ENCOUNTER — Encounter: Payer: Self-pay | Admitting: Family Medicine

## 2019-10-22 DIAGNOSIS — Z23 Encounter for immunization: Secondary | ICD-10-CM

## 2019-10-26 DIAGNOSIS — M25551 Pain in right hip: Secondary | ICD-10-CM | POA: Diagnosis not present

## 2019-11-02 DIAGNOSIS — M25551 Pain in right hip: Secondary | ICD-10-CM | POA: Diagnosis not present

## 2019-11-11 DIAGNOSIS — M25551 Pain in right hip: Secondary | ICD-10-CM | POA: Diagnosis not present

## 2019-11-16 DIAGNOSIS — M25551 Pain in right hip: Secondary | ICD-10-CM | POA: Diagnosis not present

## 2019-11-30 DIAGNOSIS — M25551 Pain in right hip: Secondary | ICD-10-CM | POA: Diagnosis not present

## 2019-12-21 DIAGNOSIS — M25551 Pain in right hip: Secondary | ICD-10-CM | POA: Diagnosis not present

## 2020-01-07 DIAGNOSIS — M25551 Pain in right hip: Secondary | ICD-10-CM | POA: Diagnosis not present

## 2020-01-20 DIAGNOSIS — M25551 Pain in right hip: Secondary | ICD-10-CM | POA: Diagnosis not present

## 2020-02-08 DIAGNOSIS — M25551 Pain in right hip: Secondary | ICD-10-CM | POA: Diagnosis not present

## 2020-02-29 DIAGNOSIS — M25551 Pain in right hip: Secondary | ICD-10-CM | POA: Diagnosis not present

## 2020-03-28 DIAGNOSIS — M25551 Pain in right hip: Secondary | ICD-10-CM | POA: Diagnosis not present

## 2020-04-07 ENCOUNTER — Encounter: Payer: Self-pay | Admitting: Family Medicine

## 2020-04-07 ENCOUNTER — Ambulatory Visit: Payer: BC Managed Care – PPO | Admitting: Family Medicine

## 2020-04-07 ENCOUNTER — Other Ambulatory Visit: Payer: Self-pay

## 2020-04-07 VITALS — BP 124/76 | HR 88 | Temp 98.1°F | Ht 67.0 in | Wt 163.0 lb

## 2020-04-07 DIAGNOSIS — K219 Gastro-esophageal reflux disease without esophagitis: Secondary | ICD-10-CM | POA: Insufficient documentation

## 2020-04-07 DIAGNOSIS — R5383 Other fatigue: Secondary | ICD-10-CM

## 2020-04-07 LAB — CBC
HCT: 43.6 % (ref 39.0–52.0)
Hemoglobin: 14.8 g/dL (ref 13.0–17.0)
MCHC: 33.9 g/dL (ref 30.0–36.0)
MCV: 84 fl (ref 78.0–100.0)
Platelets: 278 10*3/uL (ref 150.0–400.0)
RBC: 5.19 Mil/uL (ref 4.22–5.81)
RDW: 13.9 % (ref 11.5–15.5)
WBC: 5.5 10*3/uL (ref 4.0–10.5)

## 2020-04-07 LAB — COMPREHENSIVE METABOLIC PANEL
ALT: 21 U/L (ref 0–53)
AST: 21 U/L (ref 0–37)
Albumin: 4.7 g/dL (ref 3.5–5.2)
Alkaline Phosphatase: 53 U/L (ref 39–117)
BUN: 12 mg/dL (ref 6–23)
CO2: 30 mEq/L (ref 19–32)
Calcium: 10 mg/dL (ref 8.4–10.5)
Chloride: 101 mEq/L (ref 96–112)
Creatinine, Ser: 1.13 mg/dL (ref 0.40–1.50)
GFR: 79.89 mL/min (ref >60.00)
Glucose, Bld: 100 mg/dL — ABNORMAL HIGH (ref 70–99)
Potassium: 4.1 mEq/L (ref 3.5–5.1)
Sodium: 139 mEq/L (ref 135–145)
Total Bilirubin: 1 mg/dL (ref 0.2–1.2)
Total Protein: 7.3 g/dL (ref 6.0–8.3)

## 2020-04-07 LAB — VITAMIN D 25 HYDROXY (VIT D DEFICIENCY, FRACTURES): VITD: 40.38 ng/mL (ref 30.00–100.00)

## 2020-04-07 LAB — TESTOSTERONE: Testosterone: 360.26 ng/dL (ref 300.00–890.00)

## 2020-04-07 LAB — TSH: TSH: 1.81 u[IU]/mL (ref 0.35–4.50)

## 2020-04-07 LAB — H. PYLORI ANTIBODY, IGG: H Pylori IgG: NEGATIVE

## 2020-04-07 LAB — VITAMIN B12: Vitamin B-12: 297 pg/mL (ref 211–911)

## 2020-04-07 MED ORDER — PANTOPRAZOLE SODIUM 40 MG PO TBEC
40.0000 mg | DELAYED_RELEASE_TABLET | Freq: Every day | ORAL | 3 refills | Status: DC
Start: 2020-04-07 — End: 2020-06-23

## 2020-04-07 NOTE — Progress Notes (Signed)
   Alexander Aguirre is a 45 y.o. male who presents today for an office visit.  Assessment/Plan:  New/Acute Problems: Other Fatigue Check lab including CBC, CMET, TSH, testosterone, vitamin D, and B12 levels.  Chronic Problems Addressed Today: GERD (gastroesophageal reflux disease) Symptoms or not controlled.  Will start Protonix 40 mg daily for 8 weeks.  No red flag signs or symptoms.  If symptoms persist or are refractory to treatment would consider twice daily treatment for 8 weeks.  May need referral to GI if continues to be an issue despite above.  Check H pylori antibody today.     Subjective:  HPI:  Patient here with concern for GERD.  Symptoms started about 2 months ago.  He was doing some exercises on a machine that put pressure on his chest and is not sure if this is related.  Did okay for a few days and then symptoms got worse about 2 weeks later.  Tried taking Pepcid AC with modest improvement.  Ended up taking Nexium which helped for a bit.  Took for 28 days total and ended 4 days ago.  Shortly after stopping symptoms return.  Has burning sensation throughout all chest wall.  He has also been having more issues with fatigue.  He would like to have blood work checked today.       Objective:  Physical Exam: BP 124/76   Pulse 88   Temp 98.1 F (36.7 C) (Temporal)   Ht 5\' 7"  (1.702 m)   Wt 163 lb (73.9 kg)   SpO2 98%   BMI 25.53 kg/m   Wt Readings from Last 3 Encounters:  04/07/20 163 lb (73.9 kg)  07/31/19 164 lb 3.2 oz (74.5 kg)  07/09/19 161 lb 6.4 oz (73.2 kg)    Gen: No acute distress, resting comfortably CV: Regular rate and rhythm with no murmurs appreciated Pulm: Normal work of breathing, clear to auscultation bilaterally with no crackles, wheezes, or rhonchi Neuro: Grossly normal, moves all extremities Psych: Normal affect and thought content      Shawana Knoch M. 07/11/19, MD 04/07/2020 9:36 AM

## 2020-04-07 NOTE — Patient Instructions (Signed)
It was very nice to see you today!  You have GERD.  Please take the Protonix every day for at least 8 weeks.  Let me know if your symptoms return after this or they do not improve.  We will check blood work to look for other causes of your fatigue.  I will see you back in about 3 months or so for your annual physical.  Please come back to see me sooner if needed.  Take care, Dr Jimmey Ralph  PLEASE NOTE:  If you had any lab tests please let us know if you have not heard back within a few days. You may see your results on mychart before we have a chance to review them but we will give you a call once they are reviewed by [place]. If we ordered any referrals today, please let us know if you have not heard from their office within the next week.   Please try these tips to maintain a healthy lifestyle:   Eat at least 3 REAL meals and 1-2 snacks per day.  Aim for no more than 5 hours between eating.  If you eat breakfast, please do so within one hour of getting up.    Each meal should contain half fruits/vegetables, one quarter protein, and one quarter carbs (no bigger than a computer mouse)   Cut down on sweet beverages. This includes juice, soda, and sweet tea.     Drink at least 1 glass of water with each meal and aim for at least 8 glasses per day   Exercise at least 150 minutes every week.

## 2020-04-07 NOTE — Assessment & Plan Note (Addendum)
Symptoms or not controlled.  Will start Protonix 40 mg daily for 8 weeks.  No red flag signs or symptoms.  If symptoms persist or are refractory to treatment would consider twice daily treatment for 8 weeks.  May need referral to GI if continues to be an issue despite above.  Check H pylori antibody today.

## 2020-04-08 NOTE — Progress Notes (Signed)
Please inform patient of the following:  Labs are all NORMAL. Would like for him to let us know if his symptoms are not improving with treatment.  Katina Degree. Jimmey Ralph, MD 04/08/2020 9:21 AM

## 2020-04-25 DIAGNOSIS — M25551 Pain in right hip: Secondary | ICD-10-CM | POA: Diagnosis not present

## 2020-05-23 DIAGNOSIS — M25551 Pain in right hip: Secondary | ICD-10-CM | POA: Diagnosis not present

## 2020-05-30 ENCOUNTER — Ambulatory Visit (INDEPENDENT_AMBULATORY_CARE_PROVIDER_SITE_OTHER): Payer: BC Managed Care – PPO | Admitting: Family Medicine

## 2020-05-30 ENCOUNTER — Other Ambulatory Visit: Payer: Self-pay

## 2020-05-30 VITALS — BP 139/88 | HR 91 | Ht 67.0 in | Wt 171.3 lb

## 2020-05-30 DIAGNOSIS — M7661 Achilles tendinitis, right leg: Secondary | ICD-10-CM | POA: Diagnosis not present

## 2020-05-30 MED ORDER — PREDNISONE 50 MG PO TABS: 50.0000 mg | ORAL_TABLET | Freq: Every day | ORAL | 0 refills | Status: DC

## 2020-05-30 NOTE — Patient Instructions (Addendum)
Thank you for coming in today.  I think you have achillis tendonitis.   Please complete the exercises that the athletic trainer went over with you: View at my-exercise-[email or web address] using code: [id number]  Advil is ok.  Please use Voltaren gel (Generic Diclofenac Gel) up to 4x daily for pain as needed.  This is available over-the-counter as both the name brand Voltaren gel and the generic diclofenac gel.  Ice is ok.   Any shoe with a little lift in the heel will help.  If you have very bad pain fill and start taking the prednisone.   If not improving enough let me know and we can do PT.   Recheck in 1 month especially if not improving.

## 2020-05-30 NOTE — Progress Notes (Signed)
   I, Philbert Riser, LAT, ATC acting as a scribe for Clementeen Graham, MD.  Alexander Aguirre is a 45 y.o. male who presents to [facility] at [facility] today for R heel pain x yesterday morning, 5/15. MOI: Pt reports he woke up Sunday morning w/ the pain. Pt notes he walked 3-5 miles daily. Pt reports he had a similar pain 3 month ago. Pt previously saw Dr. Denyse Amass on 07/31/19 for chronic R hip pain. Today, pt locates pain to posterior aspect of calcaneous.  Foot swelling: no Aggravates: bearing weight, PF Treatments tried: IBU, ice   Pertinent review of systems: No fevers or chills  Relevant historical information: History of hip pain   Exam:  BP 139/88 (BP Location: Right Arm, Patient Position: Sitting, Cuff Size: Normal)   Pulse 91   Ht 5\' 7"  (1.702 m)   Wt 171 lb 4.8 oz (77.7 kg)   SpO2 98%   BMI 26.83 kg/m  General: Well Developed, well nourished, and in no acute distress.   MSK: Right heel normal-appearing Tender palpation posterior calcaneus at insertion of Achilles tendon. Achilles tendon itself is nontender with no gaps palpated. Normal ankle motion. Some pain with plantar flexion. Stable ligamentous exam. Intact strength.     Assessment and Plan: 45 y.o. 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  No injury pattern.  Plan for home exercise program taught in clinic today by ATC.  Additionally recommend Voltaren gel.  Backup course of prednisone prescribed in case this is more severe.  Recheck in 1 month.  Consider physical therapy if needed.   PDMP not reviewed this encounter. No orders of the defined types were placed in this encounter.  Meds ordered this encounter  Medications  . predniSONE (DELTASONE) 50 MG tablet    Sig: Take 1 tablet (50 mg total) by mouth daily.    Dispense:  5 tablet    Refill:  0     Discussed warning signs or symptoms. Please see discharge instructions. Patient expresses understanding.   The above documentation has  been reviewed and is accurate and complete 55, M.D.

## 2020-05-31 ENCOUNTER — Ambulatory Visit: Payer: BC Managed Care – PPO | Admitting: Family Medicine

## 2020-06-06 ENCOUNTER — Encounter: Payer: Self-pay | Admitting: Family Medicine

## 2020-06-06 DIAGNOSIS — K219 Gastro-esophageal reflux disease without esophagitis: Secondary | ICD-10-CM

## 2020-06-23 ENCOUNTER — Encounter: Payer: Self-pay | Admitting: Internal Medicine

## 2020-06-23 ENCOUNTER — Ambulatory Visit (INDEPENDENT_AMBULATORY_CARE_PROVIDER_SITE_OTHER): Payer: BC Managed Care – PPO | Admitting: Internal Medicine

## 2020-06-23 VITALS — BP 134/88 | HR 92 | Ht 67.0 in | Wt 171.2 lb

## 2020-06-23 DIAGNOSIS — R1013 Epigastric pain: Secondary | ICD-10-CM | POA: Diagnosis not present

## 2020-06-23 DIAGNOSIS — K219 Gastro-esophageal reflux disease without esophagitis: Secondary | ICD-10-CM

## 2020-06-23 MED ORDER — PANTOPRAZOLE SODIUM 40 MG PO TBEC: 40.0000 mg | DELAYED_RELEASE_TABLET | Freq: Every day | ORAL | 3 refills | Status: DC

## 2020-06-23 NOTE — Progress Notes (Signed)
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  Referred by his primary provider Dr. Jimmey Ralph.  Patient was previously evaluated by Dr. Jonette Eva for abdominal discomfort altered bowel habits.  A capsule endoscopy study suggested ileitis.  The patient subsequently underwent complete colonoscopy and upper endoscopy January 28, 2015.  Colonoscopy was normal as was the distal 20 cm of the terminal ileum.  EGD was normal.  Biopsies of the ileum were normal.  Biopsies from the right colon revealed nonspecific focally increased eosinophils.  Biopsies from the duodenum revealed mild focal peptic injury.  No evidence for celiac disease or IBD.  No cause for symptoms found.  Patient tells me that he was doing better until January 22 of this year when while exercising he became dizzy and bumped his lower sternum.  In early February he developed a constellation of symptoms including borborygmi which may be affected by lying on his left or right side at night.  Regurgitation and possible pyrosis.  He states that he took over-the-counter Nexium for 4 weeks.  This may have helped some.  He subsequently saw his PCP who prescribed pantoprazole 40 mg daily.  He is taken this for about 2 months.  Feels that symptoms may be somewhat worse off PPI.  He denies dysphagia.  No weight loss. Seem to be affected by meals or activities.  Review of blood work from April 07, 2020 shows unremarkable comprehensive metabolic panel.  Normal CBC with hemoglobin 14.8.  Normal TSH.  Normal vitamin D and B12 levels.  REVIEW OF SYSTEMS:  All non-GI ROS negative unless otherwise stated in the HPI   Past Medical History:  Diagnosis Date   GERD (gastroesophageal reflux disease)     Past Surgical History:  Procedure Laterality Date   COLONOSCOPY  12/09/14   in [place]   COLONOSCOPY  01/28/2015   SLF: 1. No source for abdominal pain  identified 2. Normal ileum, colon and rectum   COLONOSCOPY N/A 01/28/2015   Procedure: COLONOSCOPY;  Surgeon: West Bali, MD;  [facility];  Service: Endoscopy;  Laterality: N/A;   ESOPHAGOGASTRODUODENOSCOPY  01/28/2015   SLF: 1. No source fir abdominal pain identified   ESOPHAGOGASTRODUODENOSCOPY N/A 01/28/2015   Procedure: ESOPHAGOGASTRODUODENOSCOPY (EGD);  Surgeon: West Bali, MD;  Location: [facility];  Service: Endoscopy;  Laterality: N/A;  0730   GIVENS CAPSULE STUDY N/A 12/31/2014   Procedure: GIVENS CAPSULE STUDY;  Surgeon: West Bali, MD;  Location: [facility];  Service: Endoscopy;  Laterality: N/A;  0700   REFRACTIVE SURGERY      Social History Alexander Aguirre  reports that he has never smoked. He has never used smokeless tobacco. He reports that he does not drink alcohol and does not use drugs.  family history includes Colon polyps in his father; Diabetes in his father; High Cholesterol in his father and mother.  No Known Allergies     PHYSICAL EXAMINATION: Vital signs: BP 134/88   Pulse 92   Ht 5\' 7"  (1.702 m)   Wt 171 lb 3.2 oz (77.7 kg)   SpO2 97%   BMI 26.81 kg/m   Constitutional: generally well-appearing, no acute distress Psychiatric: alert and oriented x3, cooperative Eyes: extraocular movements intact, anicteric, conjunctiva pink Mouth: Mask Neck: supple no lymphadenopathy Cardiovascular: heart regular rate and rhythm, no murmur Lungs: clear to auscultation bilaterally Abdomen: soft, nontender, nondistended,  no obvious ascites, no peritoneal signs, normal bowel sounds, no organomegaly Rectal: Omitted Extremities: no clubbing, cyanosis, or lower extremity edema bilaterally Skin: no lesions on visible extremities Neuro: No focal deficits.  Cranial nerves intact  ASSESSMENT:  1.  Dyspepsia.  Question GERD. 2.  Borborygmi 3.  Normal upper endoscopy January 2017 4.  Normal colonoscopy with ileal intubation January  2017   PLAN:  1.  Reflux precautions 2.  Refill pantoprazole 3.  Recommend stopping pantoprazole for a week or 2 to see if symptoms worsen.  If so resume PPI 4.  Schedule upper endoscopy to evaluate dyspeptic symptoms.The nature of the procedure, as well as the risks, benefits, and alternatives were carefully and thoroughly reviewed with the patient. Ample time for discussion and questions allowed. The patient understood, was satisfied, and agreed to proceed.

## 2020-06-23 NOTE — Patient Instructions (Signed)
If you are age 43 or older, your body mass index should be between 23-30. Your Body mass index is 26.81 kg/m. If this is out of the aforementioned range listed, please consider follow up with your Primary Care Provider.  If you are age 67 or younger, your body mass index should be between 19-25. Your Body mass index is 26.81 kg/m. If this is out of the aformentioned range listed, please consider follow up with your Primary Care Provider.   __________________________________________________________  The [facility] providers would like to encourage you to use [facility] to communicate with providers for non-urgent requests or questions.  Due to long hold times on the telephone, sending your provider a message by [facility] may be a faster and more efficient way to get a response.  Please allow 48 business hours for a response.  Please remember that this is for non-urgent requests.   We have sent the following medications to your pharmacy for you to pick up at your convenience:  Pantoprazole  You have been scheduled for an endoscopy. Please follow written instructions given to you at your visit today. If you use inhalers (even only as needed), please bring them with you on the day of your procedure.

## 2020-06-27 ENCOUNTER — Telehealth: Payer: Self-pay

## 2020-06-27 NOTE — Telephone Encounter (Signed)
Patient notified, insurance may not cover if labs done prior CPE appointment [place] will wait till appointment

## 2020-06-27 NOTE — Telephone Encounter (Signed)
Patient called in wanting to have blood work prior to his cpe on 6/23. Okay to schedule lab appointment?

## 2020-06-29 NOTE — Progress Notes (Signed)
I, Christoper Fabian, LAT, ATC, am serving as scribe for Dr. Clementeen Graham.   Alexander Aguirre is a 45 y.o. male who presents to [facility] at [facility] today for R heel/Achille's pain f/u.  He was last seen by Dr. Denyse Amass on 05/30/20 and was shown a HEP focusing on gastroc eccentrics.  Since his last visit, pt reports that his R heel and Achille's are improving, rating his improvement at 85-90%.  He states that last week, he resumed doing the elliptical and yesterday he walked for 30 min.     Additionally he has a history of chronic lateral hip pain thought to be due to tendinitis.  He had an MRI arthrogram in July of last year.  He has been receiving very intermittent physical therapy at [facility] rehab where he receives dry needling with e-stim.  The physical therapist he was seeing is leaving and has been recommended to go to a specific physical therapist at [facility] offers the same service.  Pertinent review of systems: No fevers or chills  Relevant historical information: GERD   Exam:  BP 96/72 (BP Location: Left Arm, Patient Position: Sitting, Cuff Size: Normal)   Pulse 81   Ht 5\' 7"  (1.702 m)   Wt 171 lb 9.6 oz (77.8 kg)   SpO2 98%   BMI 26.88 kg/m  General: Well Developed, well nourished, and in no acute distress.   MSK: Right foot normal-appearing nontender normal motion.    Lab and Radiology Results  EXAM: MRI OF THE RIGHT HIP WITH CONTRAST   TECHNIQUE: Multiplanar, multisequence MR imaging was performed following the administration of intravenous contrast.   CONTRAST:  32mL GADAVIST GADOBUTROL 1 MMOL/ML IV SOLN   COMPARISON:  None.   FINDINGS: Both hips are normally located. Minimal degenerative chondrosis bilaterally. No stress fracture or AVN. Normal hip morphology. No findings suspicious for femoroacetabular impingement.   The anterior superior labrum is degenerated and torn. There is a small amount of air in the joint from the injection. No  loose bodies are identified otherwise.   There is mild bilateral Parry tendinosis without trochanteric bursitis. The hip and pelvic musculature is otherwise unremarkable. No muscle tear or myositis.   No significant intrapelvic abnormalities are identified.   The pubic symphysis and SI joints are intact. No pelvic fractures or bone lesions.   IMPRESSION: 1. Minimal degenerative chondrosis bilaterally but no stress fracture or AVN. 2. Degenerated and torn right anterior superior labrum. 3. 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 4. No significant intrapelvic abnormalities.     Electronically Signed   By: 0m M.D.   On: 07/28/2019 09:09   I, 07/30/2019, personally (independently) visualized and performed the interpretation of the images attached in this note.     Assessment and Plan: 45 y.o. male with right heel pain thought to be due to Achilles tendinitis improved with home exercise program.  Plan to continue home exercise program and recheck back as needed.  Could also refer to physical therapy in the future as needed.  Right lateral hip pain due to tendinitis hip abductors.  Patient receives dry needling with e-stim approximately once a month.  Will refer to [facility] where he can receive that same service.   PDMP not reviewed this encounter. Orders Placed This Encounter  Procedures   Ambulatory referral to Physical Therapy    Referral Priority:   Routine    Referral Type:   Physical Medicine    Referral Reason:  Specialty Services Required    Requested Specialty:   Physical Therapy    Number of Visits Requested:   1   No orders of the defined types were placed in this encounter.    Discussed warning signs or symptoms. Please see discharge instructions. Patient expresses understanding.   The above documentation has been reviewed and is accurate and complete Lynne Leader, M.D.

## 2020-06-30 ENCOUNTER — Other Ambulatory Visit: Payer: Self-pay

## 2020-06-30 ENCOUNTER — Encounter: Payer: Self-pay | Admitting: Family Medicine

## 2020-06-30 ENCOUNTER — Ambulatory Visit (INDEPENDENT_AMBULATORY_CARE_PROVIDER_SITE_OTHER): Payer: BC Managed Care – PPO | Admitting: Family Medicine

## 2020-06-30 VITALS — BP 96/72 | HR 81 | Ht 67.0 in | Wt 171.6 lb

## 2020-06-30 DIAGNOSIS — M25551 Pain in right hip: Secondary | ICD-10-CM | POA: Diagnosis not present

## 2020-06-30 DIAGNOSIS — M7661 Achilles tendinitis, right leg: Secondary | ICD-10-CM | POA: Diagnosis not present

## 2020-06-30 DIAGNOSIS — G8929 Other chronic pain: Secondary | ICD-10-CM

## 2020-06-30 NOTE — Patient Instructions (Signed)
Thank you for coming in today.   Continue home exercises for the achillies.  Advance activity as tolerated.   Will switch the hip PT to [place] [facility] with [facility].   Keep me updated as needed and recheck as needed.

## 2020-07-07 ENCOUNTER — Other Ambulatory Visit: Payer: Self-pay

## 2020-07-07 ENCOUNTER — Ambulatory Visit (INDEPENDENT_AMBULATORY_CARE_PROVIDER_SITE_OTHER): Payer: BC Managed Care – PPO | Admitting: Family Medicine

## 2020-07-07 ENCOUNTER — Encounter: Payer: Self-pay | Admitting: Family Medicine

## 2020-07-07 VITALS — BP 123/81 | HR 80 | Temp 97.9°F | Ht 67.0 in | Wt 165.0 lb

## 2020-07-07 DIAGNOSIS — K219 Gastro-esophageal reflux disease without esophagitis: Secondary | ICD-10-CM | POA: Diagnosis not present

## 2020-07-07 DIAGNOSIS — Z6825 Body mass index (BMI) 25.0-25.9, adult: Secondary | ICD-10-CM

## 2020-07-07 DIAGNOSIS — E785 Hyperlipidemia, unspecified: Secondary | ICD-10-CM | POA: Diagnosis not present

## 2020-07-07 DIAGNOSIS — R739 Hyperglycemia, unspecified: Secondary | ICD-10-CM

## 2020-07-07 DIAGNOSIS — Z23 Encounter for immunization: Secondary | ICD-10-CM | POA: Diagnosis not present

## 2020-07-07 DIAGNOSIS — Z0001 Encounter for general adult medical examination with abnormal findings: Secondary | ICD-10-CM | POA: Diagnosis not present

## 2020-07-07 DIAGNOSIS — E663 Overweight: Secondary | ICD-10-CM

## 2020-07-07 LAB — COMPREHENSIVE METABOLIC PANEL
ALT: 14 U/L (ref 0–53)
AST: 17 U/L (ref 0–37)
Albumin: 4.7 g/dL (ref 3.5–5.2)
Alkaline Phosphatase: 54 U/L (ref 39–117)
BUN: 16 mg/dL (ref 6–23)
CO2: 27 mEq/L (ref 19–32)
Calcium: 9.7 mg/dL (ref 8.4–10.5)
Chloride: 103 mEq/L (ref 96–112)
Creatinine, Ser: 1.18 mg/dL (ref 0.40–1.50)
GFR: 75.71 mL/min (ref >60.00)
Glucose, Bld: 90 mg/dL (ref 70–99)
Potassium: 4.2 mEq/L (ref 3.5–5.1)
Sodium: 140 mEq/L (ref 135–145)
Total Bilirubin: 1.4 mg/dL — ABNORMAL HIGH (ref 0.2–1.2)
Total Protein: 7.3 g/dL (ref 6.0–8.3)

## 2020-07-07 LAB — TSH: TSH: 1.88 u[IU]/mL (ref 0.35–4.50)

## 2020-07-07 LAB — LIPID PANEL
Cholesterol: 263 mg/dL — ABNORMAL HIGH (ref 0–200)
HDL: 46.5 mg/dL (ref >39.00)
NonHDL: 216.29
Total CHOL/HDL Ratio: 6
Triglycerides: 207 mg/dL — ABNORMAL HIGH (ref 0.0–149.0)
VLDL: 41.4 mg/dL — ABNORMAL HIGH (ref 0.0–40.0)

## 2020-07-07 LAB — CBC
HCT: 42.9 % (ref 39.0–52.0)
Hemoglobin: 14.6 g/dL (ref 13.0–17.0)
MCHC: 34.1 g/dL (ref 30.0–36.0)
MCV: 84.5 fl (ref 78.0–100.0)
Platelets: 301 10*3/uL (ref 150.0–400.0)
RBC: 5.08 Mil/uL (ref 4.22–5.81)
RDW: 13.4 % (ref 11.5–15.5)
WBC: 6 10*3/uL (ref 4.0–10.5)

## 2020-07-07 LAB — HEMOGLOBIN A1C: Hgb A1c MFr Bld: 6 % (ref 4.6–6.5)

## 2020-07-07 LAB — LDL CHOLESTEROL, DIRECT: Direct LDL: 183 mg/dL

## 2020-07-07 LAB — VITAMIN B12: Vitamin B-12: 305 pg/mL (ref 211–911)

## 2020-07-07 NOTE — Patient Instructions (Signed)
It was very nice to see you today!  We will check blood work today.  Please continue working on diet and exercise.  If your B12 is trending lower we will probably start 1000 mcg daily.  He can get this over-the-counter.  We will give your tetanus vaccine today.  I will see back in year for your next physical.  Come back to see me sooner if needed.  Take care, Dr Jimmey Ralph  PLEASE NOTE:  If you had any lab tests please let us know if you have not heard back within a few days. You may see your results on mychart before we have a chance to review them but we will give you a call once they are reviewed by [place]. If we ordered any referrals today, please let us know if you have not heard from their office within the next week.   Please try these tips to maintain a healthy lifestyle:  Eat at least 3 REAL meals and 1-2 snacks per day.  Aim for no more than 5 hours between eating.  If you eat breakfast, please do so within one hour of getting up.   Each meal should contain half fruits/vegetables, one quarter protein, and one quarter carbs (no bigger than a computer mouse)  Cut down on sweet beverages. This includes juice, soda, and sweet tea.   Drink at least 1 glass of water with each meal and aim for at least 8 glasses per day  Exercise at least 150 minutes every week.    Preventive Care 33-63 Years Old, Male Preventive care refers to lifestyle choices and visits with your health care provider that can promote health and wellness. This includes: A yearly physical exam. This is also called an annual wellness visit. Regular dental and eye exams. Immunizations. Screening for certain conditions. Healthy lifestyle choices, such as: Eating a healthy diet. Getting regular exercise. Not using drugs or products that contain nicotine and tobacco. Limiting alcohol use. What can I expect for my preventive care visit? Physical exam Your health care provider will check your: Height and weight.  These may be used to calculate your BMI (body mass index). BMI is a measurement that tells if you are at a healthy weight. Heart rate and blood pressure. Body temperature. Skin for abnormal spots. Counseling Your health care provider may ask you questions about your: Past medical problems. Family's medical history. Alcohol, tobacco, and drug use. Emotional well-being. Home life and relationship well-being. Sexual activity. Diet, exercise, and sleep habits. Work and work Astronomer. Access to firearms. What immunizations do I need?  Vaccines are usually given at various ages, according to a schedule. Your health care provider will recommend vaccines for you based on your age, medicalhistory, and lifestyle or other factors, such as travel or where you work. What tests do I need? Blood tests Lipid and cholesterol levels. These may be checked every 5 years, or more often if you are over 80 years old. Hepatitis C test. Hepatitis B test. Screening Lung cancer screening. You may have this screening every year starting at age 28 if you have a 30-pack-year history of smoking and currently smoke or have quit within the past 15 years. Prostate cancer screening. Recommendations will vary depending on your family history and other risks. Genital exam to check for testicular cancer or hernias. Colorectal cancer screening. All adults should have this screening starting at age 44 and continuing until age 59. Your health care provider may recommend screening at age 59 if  you are at increased risk. You will have tests every 1-10 years, depending on your results and the type of screening test. Diabetes screening. This is done by checking your blood sugar (glucose) after you have not eaten for a while (fasting). You may have this done every 1-3 years. STD (sexually transmitted disease) testing, if you are at risk. Follow these instructions at home: Eating and drinking  Eat a diet that includes  fresh fruits and vegetables, whole grains, lean protein, and low-fat dairy products. Take vitamin and mineral supplements as recommended by your health care provider. Do not drink alcohol if your health care provider tells you not to drink. If you drink alcohol: Limit how much you have to 0-2 drinks a day. Be aware of how much alcohol is in your drink. In the [place] one drink equals one 12 oz bottle of beer (355 mL), one 5 oz glass of wine (148 mL), or one 1 oz glass of hard liquor (44 mL).  Lifestyle Take daily care of your teeth and gums. Brush your teeth every morning and night with fluoride toothpaste. Floss one time each day. Stay active. Exercise for at least 30 minutes 5 or more days each week. Do not use any products that contain nicotine or tobacco, such as cigarettes, e-cigarettes, and chewing tobacco. If you need help quitting, ask your health care provider. Do not use drugs. If you are sexually active, practice safe sex. Use a condom or other form of protection to prevent STIs (sexually transmitted infections). If told by your health care provider, take low-dose aspirin daily starting at age 61. Find healthy ways to cope with stress, such as: Meditation, yoga, or listening to music. Journaling. Talking to a trusted person. Spending time with friends and family. Safety Always wear your seat belt while driving or riding in a vehicle. Do not drive: If you have been drinking alcohol. Do not ride with someone who has been drinking. When you are tired or distracted. While texting. Wear a helmet and other protective equipment during sports activities. If you have firearms in your house, make sure you follow all gun safety procedures. What's next? Go to your health care provider once a year for an annual wellness visit. Ask your health care provider how often you should have your eyes and teeth checked. Stay up to date on all vaccines. This information is not intended to replace  advice given to you by your health care provider. Make sure you discuss any questions you have with your healthcare provider. Document Revised: 09/30/2018 Document Reviewed: 12/26/2017 Elsevier Patient Education  2022 [employer].

## 2020-07-07 NOTE — Assessment & Plan Note (Signed)
Check A1c.  Discussed lifestyle modifications. °

## 2020-07-07 NOTE — Progress Notes (Signed)
Chief Complaint:  Alexander Aguirre is a 45 y.o. male who presents today for his annual comprehensive physical exam.    Assessment/Plan:  New/Acute Problems: Achilles tendinitis Follow-up with sports medicine  Borderline low B12 Recheck B12 today.  If trending low will will likely start supplementation 1000 mcg daily orally.  Chronic Problems Addressed Today: GERD (gastroesophageal reflux disease) Follows with GI.  Will be undergoing EGD in a couple months.  Hyperglycemia Check A1c.  Discussed lifestyle modifications.  Dyslipidemia Check lipids.   Body mass index is 25.84 kg/m. / Overweight  BMI Metric Follow Up - 07/07/20 0915       BMI Metric Follow Up-Please document annually   BMI Metric Follow Up Education provided              Preventative Healthcare: Check labs.  Tdap given today.  Patient Counseling(The following topics were reviewed and/or handout was given):  -Nutrition: Stressed importance of moderation in sodium/caffeine intake, saturated fat and cholesterol, caloric balance, sufficient intake of fresh fruits, vegetables, and fiber.  -Stressed the importance of regular exercise.   -Substance Abuse: Discussed cessation/primary prevention of tobacco, alcohol, or other drug use; driving or other dangerous activities under the influence; availability of treatment for abuse.   -Injury prevention: Discussed safety belts, safety helmets, smoke detector, smoking near bedding or upholstery.   -Sexuality: Discussed sexually transmitted diseases, partner selection, use of condoms, avoidance of unintended pregnancy and contraceptive alternatives.   -Dental health: Discussed importance of regular tooth brushing, flossing, and dental visits.  -Health maintenance and immunizations reviewed. Please refer to Health maintenance section.  Return to care in 1 year for next preventative visit.     Subjective:  HPI:  He has no acute complaints today. See a/p for status of  chronic conditions.   Lifestyle Diet: Balanced. Plenty of fruits and vegetables.  Exercise: Works on [employer] and other exercises routinely.   Depression screen [facility] 2/9 07/07/2020  Decreased Interest 0  Down, Depressed, Hopeless 0  PHQ - 2 Score 0    Health Maintenance Due  Topic Date Due   Hepatitis C Screening  Never done   TETANUS/TDAP  Never done     ROS: Per HPI, otherwise a complete review of systems was negative.   PMH:  The following were reviewed and entered/updated in epic: Past Medical History:  Diagnosis Date   GERD (gastroesophageal reflux disease)    Patient Active Problem List   Diagnosis Date Noted   GERD (gastroesophageal reflux disease) 04/07/2020   Right hip pain 07/07/2019   Hyperglycemia 01/01/2019   Dyslipidemia 07/07/2018   Past Surgical History:  Procedure Laterality Date   COLONOSCOPY  12/09/14   in [place]   COLONOSCOPY  01/28/2015   SLF: 1. No source for abdominal pain identified 2. Normal ileum, colon and rectum   COLONOSCOPY N/A 01/28/2015   Procedure: COLONOSCOPY;  Surgeon: West Bali, MD;  [facility];  Service: Endoscopy;  Laterality: N/A;   ESOPHAGOGASTRODUODENOSCOPY  01/28/2015   SLF: 1. No source fir abdominal pain identified   ESOPHAGOGASTRODUODENOSCOPY N/A 01/28/2015   Procedure: ESOPHAGOGASTRODUODENOSCOPY (EGD);  Surgeon: West Bali, MD;  [facility];  Service: Endoscopy;  Laterality: N/A;  0730   GIVENS CAPSULE STUDY N/A 12/31/2014   Procedure: GIVENS CAPSULE STUDY;  Surgeon: West Bali, MD;  [facility];  Service: Endoscopy;  Laterality: N/A;  0700   REFRACTIVE SURGERY      Family History  Problem  Relation Age of Onset   High Cholesterol Mother    Colon polyps Father    Diabetes Father    High Cholesterol Father    Colon cancer Neg Hx    Stomach cancer Neg Hx    Liver disease Neg Hx    Pancreatic cancer Neg Hx    Esophageal cancer Neg Hx     Medications-  reviewed and updated Current Outpatient Medications  Medication Sig Dispense Refill   Multiple Vitamins-Minerals (ALIVE MENS ENERGY PO) Take 1 tablet by mouth daily.     Omega-3 Fatty Acids (FISH OIL PO) Take 1 tablet by mouth daily.     pantoprazole (PROTONIX) 40 MG tablet Take 1 tablet (40 mg total) by mouth daily. 90 tablet 3   Probiotic Product (PROBIOTIC ADVANCED PO) Take 1 tablet by mouth daily.     No current facility-administered medications for this visit.    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  Tobacco Use   Smoking status: Never   Smokeless tobacco: Never  Vaping Use   Vaping Use: Never used  Substance and Sexual Activity   Alcohol use: No    Alcohol/week: 0.0 standard drinks    Comment: 3-4 drinks in a week, none in the past 6 mos   Drug use: No   Sexual activity: Not Currently  Other Topics Concern   Not on file  Social History Narrative   Lives at home w/ his parents   Right-handed   No caffeine use recently   Social Determinants of Corporate investment banker Strain: Not on file  Food Insecurity: Not on file  Transportation Needs: Not on file  Physical Activity: Not on file  Stress: Not on file  Social Connections: Not on file        Objective:  Physical Exam: BP 123/81   Pulse 80   Temp 97.9 F (36.6 C) (Temporal)   Ht 5\' 7"  (1.702 m)   Wt 165 lb (74.8 kg)   SpO2 96%   BMI 25.84 kg/m   Body mass index is 25.84 kg/m. Wt Readings from Last 3 Encounters:  07/07/20 165 lb (74.8 kg)  06/30/20 171 lb 9.6 oz (77.8 kg)  06/23/20 171 lb 3.2 oz (77.7 kg)   Gen: NAD, resting comfortably HEENT: TMs normal bilaterally. OP clear. No thyromegaly noted.  CV: RRR with no murmurs appreciated Pulm: NWOB, CTAB  with no crackles, wheezes, or rhonchi GI: Normal bowel sounds present. Soft, Nontender, Nondistended. MSK: no edema, cyanosis, or clubbing noted Skin: warm, dry Neuro: CN2-12 grossly intact. Strength 5/5 in upper and lower extremities. Reflexes symmetric and intact bilaterally.  Psych: Normal affect and thought content     Chrisotpher Rivero M. 08/23/20, MD 07/07/2020 9:15 AM

## 2020-07-07 NOTE — Assessment & Plan Note (Signed)
Check lipids 

## 2020-07-07 NOTE — Assessment & Plan Note (Signed)
Follows with GI.  Will be undergoing EGD in a couple months.

## 2020-07-08 NOTE — Progress Notes (Signed)
Please inform patient of the following:  HIs B12 is still borderline low. He can try taking B12 supplement daily to see if this helps with his energy levels and we can recheck in 3-6 months. His cholesterol level is elevated but not at the point where we need to start medications. He should really work on diet and exercise and we can recheck in a year. All of his other labs are NORMAL.  Katina Degree. Jimmey Ralph, MD 07/08/2020 8:19 AM

## 2020-08-01 IMAGING — DX DG KNEE AP/LAT W/ SUNRISE*L*
3 series · 3 of 3 positions shown · non-contrast
Comparison: None.

CLINICAL DATA: Left knee pain, no known injury

EXAM:
LEFT KNEE 3 VIEWS

[knee ap]
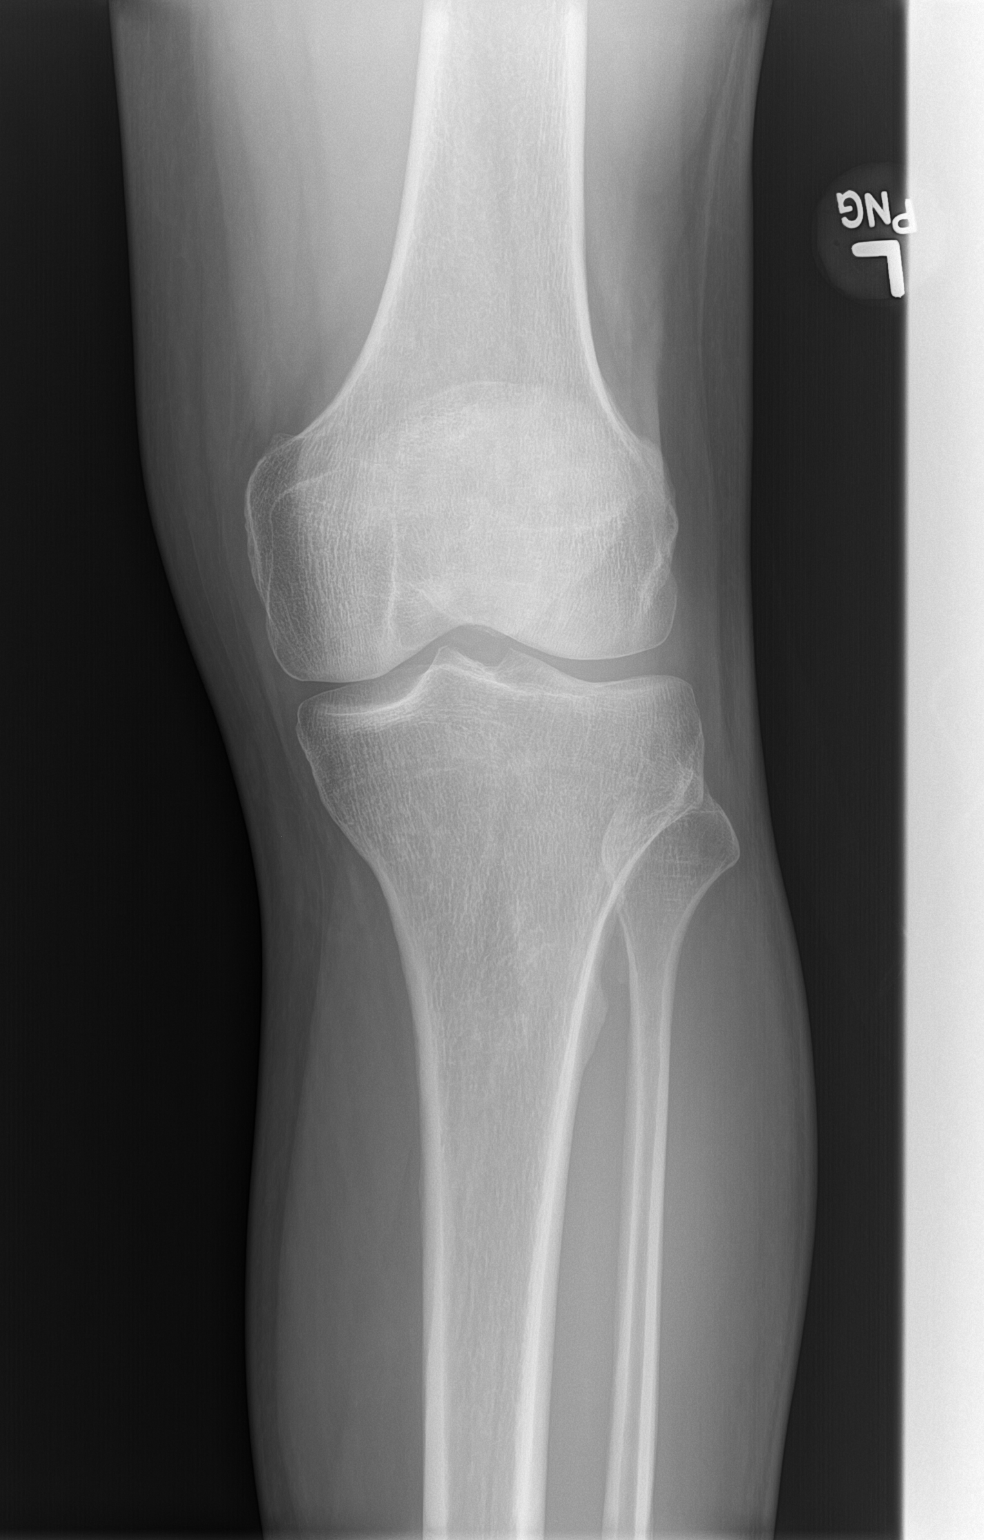

[knee lat]
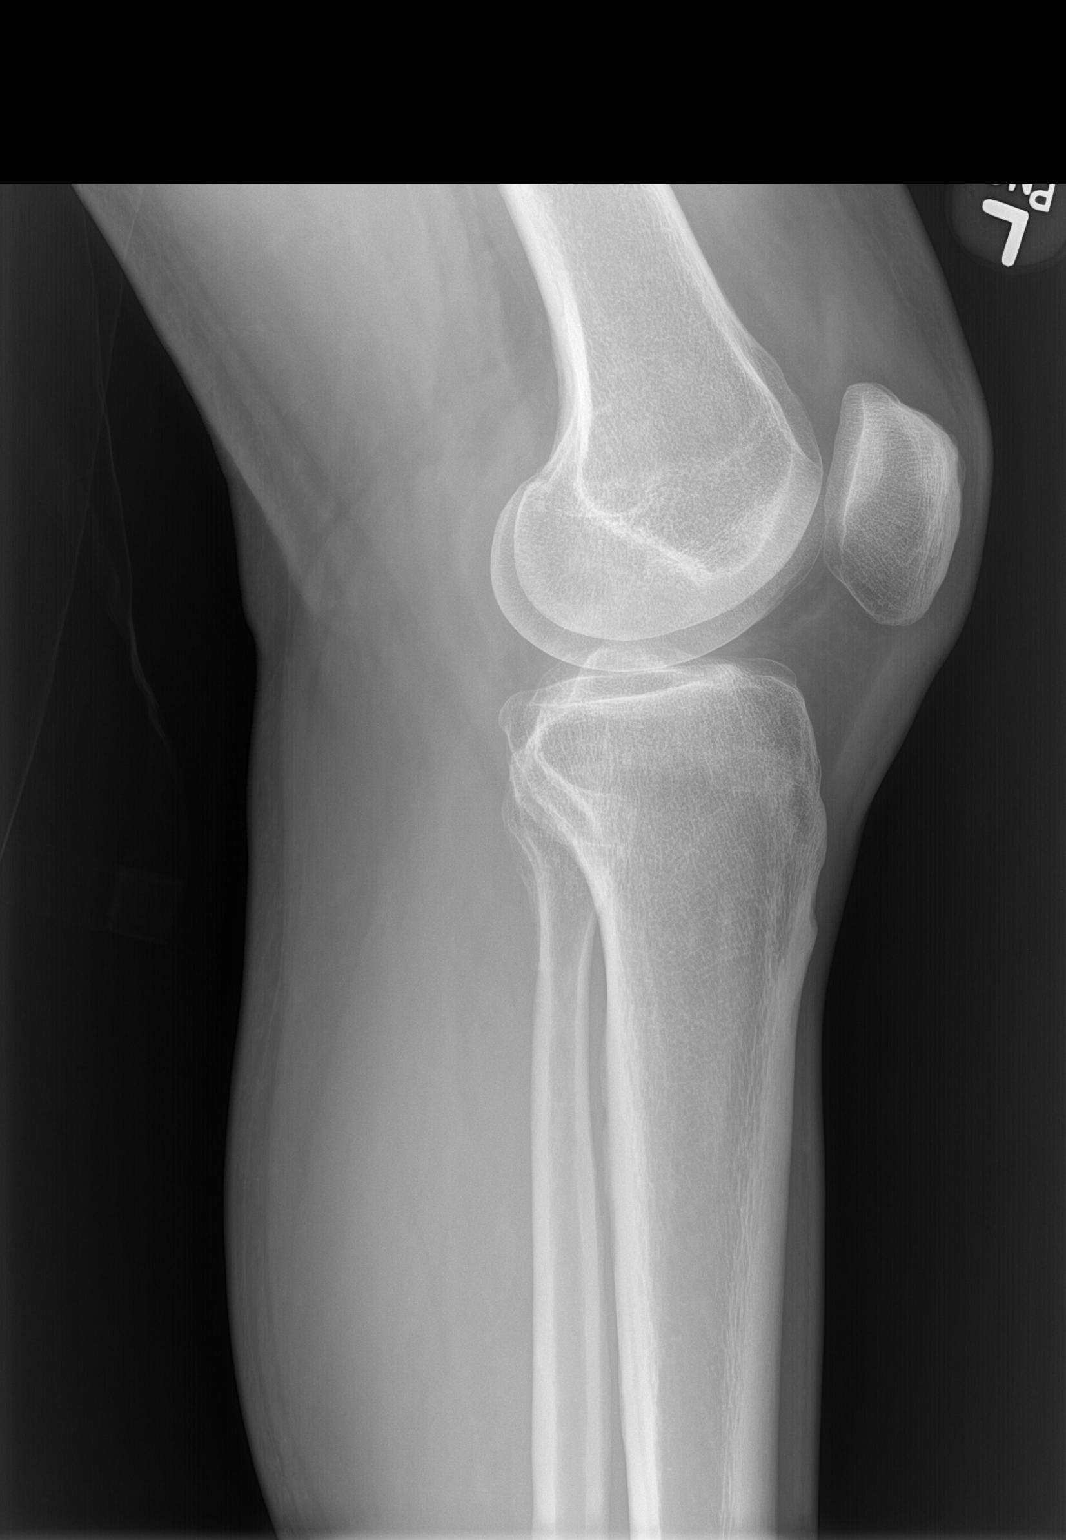

[patella]
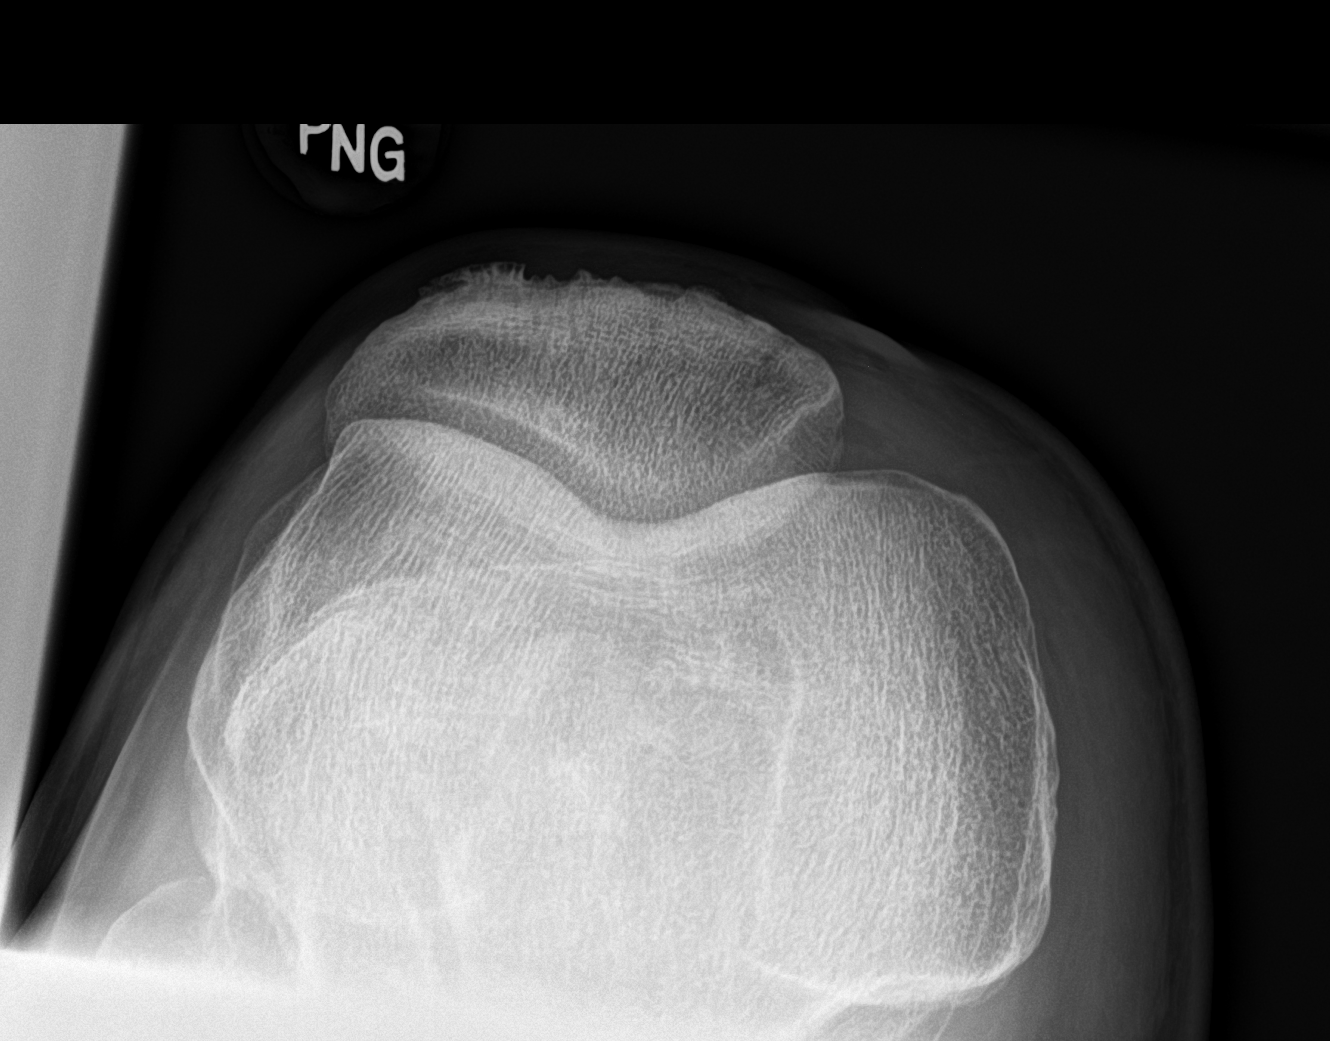

[3 of 3 positions shown; findings below may reference images not displayed]

FINDINGS: No fracture or dislocation of the left knee. Joint spaces are
preserved. Small, nonspecific knee joint effusion. Soft tissues are
unremarkable.
IMPRESSION: 1. No fracture or dislocation of the left knee. Joint spaces are
well preserved.

2.  Small, nonspecific knee joint effusion.

## 2020-08-16 ENCOUNTER — Encounter: Payer: Self-pay | Admitting: Internal Medicine

## 2020-08-24 ENCOUNTER — Encounter: Payer: Self-pay | Admitting: Internal Medicine

## 2020-08-24 ENCOUNTER — Other Ambulatory Visit: Payer: Self-pay

## 2020-08-24 ENCOUNTER — Ambulatory Visit (AMBULATORY_SURGERY_CENTER): Payer: BC Managed Care – PPO | Admitting: Internal Medicine

## 2020-08-24 VITALS — BP 112/69 | HR 81 | Temp 98.6°F | Resp 14 | Ht 67.0 in | Wt 171.0 lb

## 2020-08-24 DIAGNOSIS — K219 Gastro-esophageal reflux disease without esophagitis: Secondary | ICD-10-CM | POA: Diagnosis not present

## 2020-08-24 DIAGNOSIS — K319 Disease of stomach and duodenum, unspecified: Secondary | ICD-10-CM

## 2020-08-24 DIAGNOSIS — K2100 Gastro-esophageal reflux disease with esophagitis, without bleeding: Secondary | ICD-10-CM | POA: Diagnosis not present

## 2020-08-24 MED ORDER — SODIUM CHLORIDE 0.9 % IV SOLN
500.0000 mL | Freq: Once | INTRAVENOUS | Status: DC
Start: 2020-08-24 — End: 2020-08-24

## 2020-08-24 NOTE — Progress Notes (Signed)
Pt in recovery with monitors in place, VSS. Report given to receiving RN. Bite guard was placed with pt awake to ensure comfort. No dental or soft tissue damage noted. RN will remove the guard when the pt is awake.  

## 2020-08-24 NOTE — Progress Notes (Signed)
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  Referred by his primary provider Dr. Jimmey Ralph.  Patient was previously evaluated by Dr. Jonette Eva for abdominal discomfort altered bowel habits.  A capsule endoscopy study suggested ileitis.  The patient subsequently underwent complete colonoscopy and upper endoscopy January 28, 2015.  Colonoscopy was normal as was the distal 20 cm of the terminal ileum.  EGD was normal.  Biopsies of the ileum were normal.  Biopsies from the right colon revealed nonspecific focally increased eosinophils.  Biopsies from the duodenum revealed mild focal peptic injury.  No evidence for celiac disease or IBD.  No cause for symptoms found.  Patient tells me that he was doing better until January 22 of this year when while exercising he became dizzy and bumped his lower sternum.  In early February he developed a constellation of symptoms including borborygmi which may be affected by lying on his left or right side at night.  Regurgitation and possible pyrosis.  He states that he took over-the-counter Nexium for 4 weeks.  This may have helped some.  He subsequently saw his PCP who prescribed pantoprazole 40 mg daily.  He is taken this for about 2 months.  Feels that symptoms may be somewhat worse off PPI.  He denies dysphagia.  No weight loss. Seem to be affected by meals or activities.  Review of blood work from April 07, 2020 shows unremarkable comprehensive metabolic panel.  Normal CBC with hemoglobin 14.8.  Normal TSH.  Normal vitamin D and B12 levels.   REVIEW OF SYSTEMS:   All non-GI ROS negative unless otherwise stated in the HPI        Past Medical History:  Diagnosis Date   GERD (gastroesophageal reflux disease)             Past Surgical History:  Procedure Laterality Date   COLONOSCOPY   12/09/14    in [place]   COLONOSCOPY   01/28/2015    SLF: 1. No source for  abdominal pain identified 2. Normal ileum, colon and rectum   COLONOSCOPY N/A 01/28/2015    Procedure: COLONOSCOPY;  Surgeon: West Bali, MD;  [facility];  Service: Endoscopy;  Laterality: N/A;   ESOPHAGOGASTRODUODENOSCOPY   01/28/2015    SLF: 1. No source fir abdominal pain identified   ESOPHAGOGASTRODUODENOSCOPY N/A 01/28/2015    Procedure: ESOPHAGOGASTRODUODENOSCOPY (EGD);  Surgeon: West Bali, MD;  [facility];  Service: Endoscopy;  Laterality: N/A;  0730   GIVENS CAPSULE STUDY N/A 12/31/2014    Procedure: GIVENS CAPSULE STUDY;  Surgeon: West Bali, MD;  [facility];  Service: Endoscopy;  Laterality: N/A;  0700   REFRACTIVE SURGERY          Social History Alexander Aguirre  reports that he has never smoked. He has never used smokeless tobacco. He reports that he does not drink alcohol and does not use drugs.   family history includes Colon polyps in his father; Diabetes in his father; High Cholesterol in his father and mother.   No Known Allergies       PHYSICAL EXAMINATION: Vital signs: BP 134/88   Pulse 92   Ht 5\' 7"  (1.702 m)   Wt 171 lb 3.2 oz (77.7 kg)   SpO2 97%   BMI 26.81 kg/m   Constitutional: generally well-appearing, no acute distress Psychiatric: alert and  oriented x3, cooperative Eyes: extraocular movements intact, anicteric, conjunctiva pink Mouth: Mask Neck: supple no lymphadenopathy Cardiovascular: heart regular rate and rhythm, no murmur Lungs: clear to auscultation bilaterally Abdomen: soft, nontender, nondistended, no obvious ascites, no peritoneal signs, normal bowel sounds, no organomegaly Rectal: Omitted Extremities: no clubbing, cyanosis, or lower extremity edema bilaterally Skin: no lesions on visible extremities Neuro: No focal deficits.  Cranial nerves intact   ASSESSMENT:   1.  Dyspepsia.  Question GERD. 2.  Borborygmi 3.  Normal upper endoscopy January 2017 4.  Normal colonoscopy with ileal  intubation January 2017     PLAN:   1.  Reflux precautions 2.  Refill pantoprazole 3.  Recommend stopping pantoprazole for a week or 2 to see if symptoms worsen.  If so resume PPI 4.  Schedule upper endoscopy to evaluate dyspeptic symptoms.The nature of the procedure, as well as the risks, benefits, and alternatives were carefully and thoroughly reviewed with the patient. Ample time for discussion and questions allowed. The patient understood, was satisfied, and agreed to proceed.   GI PREPROCEDURE NOTE  Patient presents today for upper endoscopy to evaluate dyspeptic symptoms and possible GERD.  He was thoroughly evaluated in the office 02/24/2020.  See the above encounter.  No interval change in history or physical exam.  Wilhemina Bonito. Eda Keys., M.D. [facility] Division of Gastroenterology

## 2020-08-24 NOTE — Progress Notes (Signed)
Called to room to assist during endoscopic procedure.  Patient ID and intended procedure confirmed with present staff. Received instructions for my participation in the procedure from the performing physician.  

## 2020-08-24 NOTE — Patient Instructions (Signed)
YOU HAD AN ENDOSCOPIC PROCEDURE TODAY AT THE [facility]:   Refer to the procedure report that was given to you for any specific questions about what was found during the examination.  If the procedure report does not answer your questions, please call your gastroenterologist to clarify.  If you requested that your care partner not be given the details of your procedure findings, then the procedure report has been included in a sealed envelope for you to review at your convenience later.  YOU SHOULD EXPECT: Some feelings of bloating in the abdomen. Passage of more gas than usual.  Walking can help get rid of the air that was put into your GI tract during the procedure and reduce the bloating. If you had a lower endoscopy (such as a colonoscopy or flexible sigmoidoscopy) you may notice spotting of blood in your stool or on the toilet paper. If you underwent a bowel prep for your procedure, you may not have a normal bowel movement for a few days.  Please Note:  You might notice some irritation and congestion in your nose or some drainage.  This is from the oxygen used during your procedure.  There is no need for concern and it should clear up in a day or so.  SYMPTOMS TO REPORT IMMEDIATELY:    Following upper endoscopy (EGD)  Vomiting of blood or coffee ground material  New chest pain or pain under the shoulder blades  Painful or persistently difficult swallowing  New shortness of breath  Fever of 100F or higher  Black, tarry-looking stools  For urgent or emergent issues, a gastroenterologist can be reached at any hour by calling [phone]. Do not use MyChart messaging for urgent concerns.    DIET:  We do recommend a small meal at first, but then you may proceed to your regular diet.  Drink plenty of fluids but you should avoid alcoholic beverages for 24 hours.  ACTIVITY:  You should plan to take it easy for the rest of today and you should NOT DRIVE or use heavy machinery  until tomorrow (because of the sedation medicines used during the test).    FOLLOW UP: Our staff will call the number listed on your records 48-72 hours following your procedure to check on you and address any questions or concerns that you may have regarding the information given to you following your procedure. If we do not reach you, we will leave a message.  We will attempt to reach you two times.  During this call, we will ask if you have developed any symptoms of COVID 19. If you develop any symptoms (ie: fever, flu-like symptoms, shortness of breath, cough etc.) before then, please call [phone].  If you test positive for Covid 19 in the 2 weeks post procedure, please call and report this information to us.    If any biopsies were taken you will be contacted by phone or by letter within the next 1-3 weeks.  Please call us at [phone] if you have not heard about the biopsies in 3 weeks.    SIGNATURES/CONFIDENTIALITY: You and/or your care partner have signed paperwork which will be entered into your electronic medical record.  These signatures attest to the fact that that the information above on your After Visit Summary has been reviewed and is understood.  Full responsibility of the confidentiality of this discharge information lies with you and/or your care-partner. 

## 2020-08-24 NOTE — Op Note (Signed)
[facility] Patient Name: Alexander Aguirre Procedure Date: 08/24/2020 9:42 AM MRN: [id number] Endoscopist: Wilhemina Bonito. Marina Goodell , MD Age: 45 Referring MD:  Date of Birth: Nov 18, 1975 Gender: Male Account #: [id number] Procedure:                Upper GI endoscopy with biopsies Indications:              Dyspepsia, Suspected esophageal reflux Medicines:                Monitored Anesthesia Care Procedure:                Pre-Anesthesia Assessment:                           - Prior to the procedure, a History and Physical                            was performed, and patient medications and                            allergies were reviewed. The patient's tolerance of                            previous anesthesia was also reviewed. The risks                            and benefits of the procedure and the sedation                            options and risks were discussed with the patient.                            All questions were answered, and informed consent                            was obtained. Prior Anticoagulants: The patient has                            taken no previous anticoagulant or antiplatelet                            agents. ASA Grade Assessment: I - A normal, healthy                            patient. After reviewing the risks and benefits,                            the patient was deemed in satisfactory condition to                            undergo the procedure.                           After obtaining informed consent, the endoscope was  passed under direct vision. Throughout the                            procedure, the patient's blood pressure, pulse, and                            oxygen saturations were monitored continuously. The                            GIF [id number] #[id number] was introduced through the                            mouth, and advanced to the second part of duodenum.                            The upper GI  endoscopy was accomplished without                            difficulty. The patient tolerated the procedure                            well. Scope In: Scope Out: Findings:                 The esophagus revealed mild esophagitis as                            manifested by friability at the Z-line. No                            Barrett's.                           The stomach was normal. Biopsies were taken with a                            cold forceps for histology to rule out H. pylori.                           The examined duodenum was normal.                           The cardia and gastric fundus were normal on                            retroflexion. Complications:            No immediate complications. Estimated Blood Loss:     Estimated blood loss: none. Impression:               1. GERD with mild esophagitis                           2. Otherwise normal EGD status post gastric                            biopsies. Recommendation:  1. Patient has a contact number available for                            emergencies. The signs and symptoms of potential                            delayed complications were discussed with the                            patient. Return to normal activities tomorrow.                            Written discharge instructions were provided to the                            patient.                           2. Resume previous diet. Reflux precautions.                           3. Continue present medications.                           4. Await pathology results.                           5. Okay to use PPI (pantoprazole) on demand for                            active reflux symptoms Jimmie Rueter N. Marina Goodell, MD 08/24/2020 10:12:40 AM This report has been signed electronically.

## 2020-08-24 NOTE — Progress Notes (Signed)
Vs by cw.  

## 2020-08-26 ENCOUNTER — Telehealth: Payer: Self-pay

## 2020-08-26 ENCOUNTER — Encounter: Payer: Self-pay | Admitting: Internal Medicine

## 2020-08-26 NOTE — Telephone Encounter (Signed)
  Follow up Call-  Call back number 08/24/2020  Post procedure Call Back phone  # [phone]  Permission to leave phone message Yes  Some recent data might be hidden     Patient questions:  Do you have a fever, pain , or abdominal swelling? No. Pain Score  0 *  Have you tolerated food without any problems? Yes.    Have you been able to return to your normal activities? Yes.    Do you have any questions about your discharge instructions: Diet   No. Medications  No. Follow up visit  No.  Do you have questions or concerns about your Care? No.  Actions: * If pain score is 4 or above: No action needed, pain <4.  Have you developed a fever since your procedure? no  2.   Have you had an respiratory symptoms (SOB or cough) since your procedure? no  3.   Have you tested positive for COVID 19 since your procedure no  4.   Have you had any family members/close contacts diagnosed with the COVID 19 since your procedure?  no   If yes to any of these questions please route to Laverna Peace, RN and Karlton Lemon, RN

## 2020-09-15 ENCOUNTER — Ambulatory Visit (INDEPENDENT_AMBULATORY_CARE_PROVIDER_SITE_OTHER): Payer: BC Managed Care – PPO

## 2020-09-15 ENCOUNTER — Other Ambulatory Visit: Payer: Self-pay

## 2020-09-15 DIAGNOSIS — Z23 Encounter for immunization: Secondary | ICD-10-CM | POA: Diagnosis not present

## 2020-12-19 ENCOUNTER — Ambulatory Visit: Payer: BC Managed Care – PPO | Admitting: Family Medicine

## 2020-12-21 ENCOUNTER — Ambulatory Visit: Payer: BC Managed Care – PPO | Admitting: Family Medicine

## 2020-12-21 ENCOUNTER — Encounter: Payer: Self-pay | Admitting: Family Medicine

## 2020-12-21 ENCOUNTER — Other Ambulatory Visit: Payer: Self-pay

## 2020-12-21 VITALS — BP 135/87 | HR 73 | Temp 97.4°F | Ht 67.0 in | Wt 171.0 lb

## 2020-12-21 DIAGNOSIS — R202 Paresthesia of skin: Secondary | ICD-10-CM

## 2020-12-21 DIAGNOSIS — K219 Gastro-esophageal reflux disease without esophagitis: Secondary | ICD-10-CM | POA: Diagnosis not present

## 2020-12-21 DIAGNOSIS — R739 Hyperglycemia, unspecified: Secondary | ICD-10-CM

## 2020-12-21 LAB — CBC
HCT: 43.2 % (ref 39.0–52.0)
Hemoglobin: 14.3 g/dL (ref 13.0–17.0)
MCHC: 33 g/dL (ref 30.0–36.0)
MCV: 83 fl (ref 78.0–100.0)
Platelets: 270 10*3/uL (ref 150.0–400.0)
RBC: 5.2 Mil/uL (ref 4.22–5.81)
RDW: 13.9 % (ref 11.5–15.5)
WBC: 5.3 10*3/uL (ref 4.0–10.5)

## 2020-12-21 LAB — VITAMIN B12: Vitamin B-12: 634 pg/mL (ref 211–911)

## 2020-12-21 LAB — COMPREHENSIVE METABOLIC PANEL
ALT: 13 U/L (ref 0–53)
AST: 20 U/L (ref 0–37)
Albumin: 4.3 g/dL (ref 3.5–5.2)
Alkaline Phosphatase: 55 U/L (ref 39–117)
BUN: 11 mg/dL (ref 6–23)
CO2: 30 mEq/L (ref 19–32)
Calcium: 9.4 mg/dL (ref 8.4–10.5)
Chloride: 103 mEq/L (ref 96–112)
Creatinine, Ser: 1.12 mg/dL (ref 0.40–1.50)
GFR: 80.35 mL/min (ref >60.00)
Glucose, Bld: 84 mg/dL (ref 70–99)
Potassium: 4.4 mEq/L (ref 3.5–5.1)
Sodium: 139 mEq/L (ref 135–145)
Total Bilirubin: 1 mg/dL (ref 0.2–1.2)
Total Protein: 7.1 g/dL (ref 6.0–8.3)

## 2020-12-21 LAB — TSH: TSH: 2.13 u[IU]/mL (ref 0.35–5.50)

## 2020-12-21 LAB — HEMOGLOBIN A1C: Hgb A1c MFr Bld: 6.2 % (ref 4.6–6.5)

## 2020-12-21 MED ORDER — AZITHROMYCIN 250 MG PO TABS: ORAL_TABLET | ORAL | 0 refills | Status: DC

## 2020-12-21 NOTE — Progress Notes (Signed)
   Alexander Aguirre is a 45 y.o. male who presents today for an office visit.  Assessment/Plan:  New/Acute Problems: Paresthesias No red flags.  Likely multifactorial.  High degree of suspicion for B12 deficiency given his use of PPI and borderline low readings a few months ago.  We will check this today and blood work.  We will also check other labs including CBC, c-Met, TSH.  It is also possible he has some element of compressive neuropathy given his positive Tinel sign and positive straight leg raise on exam today.  Depending on results of his labs may consider referral back over to sports medicine.  Chronic Problems Addressed Today: GERD (gastroesophageal reflux disease) Following with GI.  Home Protonix 40 mg daily.  Symptoms are still not controlled.  He had a normal EGD.  This could be contributing some to B12 deficiency.  Hyperglycemia Last A1c 6.0.  Doubt this is contributing to his paresthesias though we will recheck A1c today.     Subjective:  HPI:  Patient here with numbness and tingling in his hands and feet. Started a few months ago. Initially located in left hand and fingers.  He thinks this started after doing some exercises at the gym.  Symptoms started about 3 months ago.  He felt some pain in his neck.  He stopped doing the exercises however symptoms persisted.  Over the last several weeks he has noticed similar numbness and tingling sensation in his left foot as well as his right hand.  He will sometimes wake up in the middle of the night with both of his hands numb.  He was not able to start a B12 supplement that we recommended a few months ago.  He has also had ongoing issues with gastric reflux.  He underwent EGD a few months ago.  He was told that everything was normal.  He has to sleep with a wedge pillow at night.  He is taking Protonix 40 mg daily.  No weakness.       Objective:  Physical Exam: BP 135/87 (BP Location: Right Arm)   Pulse 73   Temp (!) 97.4 F  (36.3 C) (Temporal)   Ht $R'5\' 7"'GD$  (1.702 m)   Wt 171 lb (77.6 kg)   SpO2 99%   BMI 26.78 kg/m   Gen: No acute distress, resting comfortably CV: Regular rate and rhythm with no murmurs appreciated Pulm: Normal work of breathing, clear to auscultation bilaterally with no crackles, wheezes, or rhonchi MSK: - Neck: No deformities.  Negative Spurling bilaterally. -  BacK: No deformities.  Nontender to palpation. - Left Arm: Negative Tinel's sign at left wrist.  Positive Tinel's at the left medial epicondyle.  Neurovascular intact distally. - Right Arm: Negative Tinel's sign at left wrist and medial epicondyle.  Neurovascular intact distally. - Left Foot: Normal strength.  No deformities.  Negative Tinel's sign at the lateral and medial malleoli bilaterally.  Positive straight leg raise. Neuro: Grossly normal, moves all extremities Psych: Normal affect and thought content      [name] M. Jerline Pain, MD 12/21/2020 11:39 AM

## 2020-12-21 NOTE — Patient Instructions (Addendum)
It was very nice to see you today!  We will check blood work to check your B12 and other possible causes.  I think you have a few mild pinched nerves that are likely also contributing.   We may need you to see sports medicine depending on the results of your blood work.  Please follow-up with the GI specialist for your reflux.  Take care, Dr Jimmey Ralph  PLEASE NOTE:  If you had any lab tests please let us know if you have not heard back within a few days. You may see your results on mychart before we have a chance to review them but we will give you a call once they are reviewed by [place]. If we ordered any referrals today, please let us know if you have not heard from their office within the next week.   Please try these tips to maintain a healthy lifestyle:  Eat at least 3 REAL meals and 1-2 snacks per day.  Aim for no more than 5 hours between eating.  If you eat breakfast, please do so within one hour of getting up.   Each meal should contain half fruits/vegetables, one quarter protein, and one quarter carbs (no bigger than a computer mouse)  Cut down on sweet beverages. This includes juice, soda, and sweet tea.   Drink at least 1 glass of water with each meal and aim for at least 8 glasses per day  Exercise at least 150 minutes every week.

## 2020-12-21 NOTE — Assessment & Plan Note (Signed)
Last A1c 6.0.  Doubt this is contributing to his paresthesias though we will recheck A1c today.

## 2020-12-21 NOTE — Assessment & Plan Note (Signed)
Following with GI.  Home Protonix 40 mg daily.  Symptoms are still not controlled.  He had a normal EGD.  This could be contributing some to B12 deficiency.

## 2020-12-22 ENCOUNTER — Other Ambulatory Visit: Payer: Self-pay | Admitting: *Deleted

## 2020-12-22 DIAGNOSIS — M25551 Pain in right hip: Secondary | ICD-10-CM

## 2020-12-22 NOTE — Progress Notes (Signed)
Please inform patient of the following:  His blood sugar is borderline elevated but stable. His B12 is normal. No other obvious explanations for his symptoms based on blood work. I am concerned he may have a pinched nerve. Recommend referral to sports medicine as we discussed at his office visit.  Katina Degree. Jimmey Ralph, MD 12/22/2020 12:58 PM

## 2020-12-23 ENCOUNTER — Ambulatory Visit (INDEPENDENT_AMBULATORY_CARE_PROVIDER_SITE_OTHER): Payer: BC Managed Care – PPO | Admitting: Family Medicine

## 2020-12-23 ENCOUNTER — Ambulatory Visit (INDEPENDENT_AMBULATORY_CARE_PROVIDER_SITE_OTHER): Payer: BC Managed Care – PPO

## 2020-12-23 ENCOUNTER — Other Ambulatory Visit: Payer: Self-pay

## 2020-12-23 VITALS — BP 124/82 | HR 93 | Ht 67.0 in | Wt 176.0 lb

## 2020-12-23 DIAGNOSIS — R202 Paresthesia of skin: Secondary | ICD-10-CM

## 2020-12-23 DIAGNOSIS — M545 Low back pain, unspecified: Secondary | ICD-10-CM | POA: Diagnosis not present

## 2020-12-23 DIAGNOSIS — M79609 Pain in unspecified limb: Secondary | ICD-10-CM

## 2020-12-23 DIAGNOSIS — M47812 Spondylosis without myelopathy or radiculopathy, cervical region: Secondary | ICD-10-CM | POA: Diagnosis not present

## 2020-12-23 MED ORDER — GABAPENTIN 300 MG PO CAPS: 300.0000 mg | ORAL_CAPSULE | Freq: Three times a day (TID) | ORAL | 3 refills | Status: DC | PRN

## 2020-12-23 NOTE — Progress Notes (Signed)
[name], am serving as a Neurosurgeon for Dr. Earma Reading.  This visit occurred during the SARS-CoV-2 public health emergency.  Safety protocols were in place, including screening questions prior to the visit, additional usage of staff PPE, and extensive cleaning of exam room while observing appropriate contact time as indicated for disinfecting solutions.   Alexander Aguirre is a 45 y.o. male who presents to [facility] at [facility] today for tingling sensation in left hand and foot, sometimes right hand. Last week when waking up hands felt numb, but then dissipates. Tingling began 10/10/2020 in left hand. Not constant sporadic. No discoloration in hands or feet, but does state that they feel cooler.  Symptoms occur throughout his entire hand including the radial and ulnar aspect.  Symptoms are worse often when he wakes up at bedtime.  He cannot find any exacerbating or alleviating activity.   Pertinent review of systems: No fevers or chills  Relevant historical information: GERD.   Exam:  BP 124/82   Pulse 93   Ht 5\' 7"  (1.702 m)   Wt 176 lb (79.8 kg)   SpO2 97%   BMI 27.57 kg/m  General: Well Developed, well nourished, and in no acute distress.   MSK:  C-spine: Normal-appearing Normal cervical motion. Upper extremity strength is intact. Reflexes are intact. Negative Spurling's test.  Left elbow normal-appearing Normal motion. Negative Tinel's test at cubital tunnel. Left wrist normal-appearing Negative Tinel's at carpal tunnel and negative Phalen's test.  L-spine normal-appearing Nontender normal lumbar motion negative slump test.  Lower extremity strength reflexes and sensation are intact.    Lab and Radiology Results Results for orders placed or performed in visit on 12/21/20 (from the past 72 hour(s))  Vitamin B12     Status: None   Collection Time: 12/21/20 11:54 AM  Result Value Ref Range   Vitamin B-12 634 211 - 911 pg/mL  CBC     Status:  None   Collection Time: 12/21/20 11:54 AM  Result Value Ref Range   WBC 5.3 4.0 - 10.5 K/uL   RBC 5.20 4.22 - 5.81 Mil/uL   Platelets 270.0 150.0 - 400.0 K/uL   Hemoglobin 14.3 13.0 - 17.0 g/dL   HCT 14/07/22 63.7 - 85.8 %   MCV 83.0 78.0 - 100.0 fl   MCHC 33.0 30.0 - 36.0 g/dL   RDW 85.0 27.7 - 41.2 %  Comprehensive metabolic panel     Status: None   Collection Time: 12/21/20 11:54 AM  Result Value Ref Range   Sodium 139 135 - 145 mEq/L   Potassium 4.4 3.5 - 5.1 mEq/L   Chloride 103 96 - 112 mEq/L   CO2 30 19 - 32 mEq/L   Glucose, Bld 84 70 - 99 mg/dL   BUN 11 6 - 23 mg/dL   Creatinine, Ser 14/07/22 0.40 - 1.50 mg/dL   Total Bilirubin 1.0 0.2 - 1.2 mg/dL   Alkaline Phosphatase 55 39 - 117 U/L   AST 20 0 - 37 U/L   ALT 13 0 - 53 U/L   Total Protein 7.1 6.0 - 8.3 g/dL   Albumin 4.3 3.5 - 5.2 g/dL   GFR 6.76 72.09 mL/min    Comment: Calculated using the CKD-EPI Creatinine Equation (2021)   Calcium 9.4 8.4 - 10.5 mg/dL  Hemoglobin 01-11-1970     Status: None   Collection Time: 12/21/20 11:54 AM  Result Value Ref Range   Hgb A1c MFr Bld 6.2 4.6 - 6.5 %  Comment: Glycemic Control Guidelines for People with Diabetes:Non Diabetic:  <6%Goal of Therapy: <7%Additional Action Suggested:  >8%   TSH     Status: None   Collection Time: 12/21/20 11:54 AM  Result Value Ref Range   TSH 2.13 0.35 - 5.50 uIU/mL   No results found.   X-ray images L-spine and C-spine obtained today personally and independently interpreted  C-spine: No significant degenerative changes.  No acute fractures.  L-spine: Anterior superior T12-L1.  No acute fractures.  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  Await formal radiology review  Recent Results (from the past 2160 hour(s))  Vitamin B12     Status: None   Collection Time: 12/21/20 11:54 AM  Result Value Ref Range   Vitamin B-12 634 211 - 911 pg/mL  CBC     Status: None   Collection Time:  12/21/20 11:54 AM  Result Value Ref Range   WBC 5.3 4.0 - 10.5 K/uL   RBC 5.20 4.22 - 5.81 Mil/uL   Platelets 270.0 150.0 - 400.0 K/uL   Hemoglobin 14.3 13.0 - 17.0 g/dL   HCT 48.5 46.2 - 70.3 %   MCV 83.0 78.0 - 100.0 fl   MCHC 33.0 30.0 - 36.0 g/dL   RDW 50.0 93.8 - 18.2 %  Comprehensive metabolic panel     Status: None   Collection Time: 12/21/20 11:54 AM  Result Value Ref Range   Sodium 139 135 - 145 mEq/L   Potassium 4.4 3.5 - 5.1 mEq/L   Chloride 103 96 - 112 mEq/L   CO2 30 19 - 32 mEq/L   Glucose, Bld 84 70 - 99 mg/dL   BUN 11 6 - 23 mg/dL   Creatinine, Ser 9.93 0.40 - 1.50 mg/dL   Total Bilirubin 1.0 0.2 - 1.2 mg/dL   Alkaline Phosphatase 55 39 - 117 U/L   AST 20 0 - 37 U/L   ALT 13 0 - 53 U/L   Total Protein 7.1 6.0 - 8.3 g/dL   Albumin 4.3 3.5 - 5.2 g/dL   GFR 71.69 >67.89 mL/min    Comment: Calculated using the CKD-EPI Creatinine Equation (2021)   Calcium 9.4 8.4 - 10.5 mg/dL  Hemoglobin F8B     Status: None   Collection Time: 12/21/20 11:54 AM  Result Value Ref Range   Hgb A1c MFr Bld 6.2 4.6 - 6.5 %    Comment: Glycemic Control Guidelines for People with Diabetes:Non Diabetic:  <6%Goal of Therapy: <7%Additional Action Suggested:  >8%   TSH     Status: None   Collection Time: 12/21/20 11:54 AM  Result Value Ref Range   TSH 2.13 0.35 - 5.50 uIU/mL     Assessment and Plan: 45 y.o. male with paresthesias predominantly involving the left hand and left foot.  Etiology unclear.  Distribution of location of paresthesias do not correspond to typical dermatomal patterns.   Left hand could be both ulnar and medial nerve or both C7 and C8 or more diffuse neuropathy.  Left leg is less clear.  Metabolic work-up was benign.  Plan for nerve conduction study and trial of cubital tunnel splint.  Recheck in 6 weeks.  Additionally trial gabapentin  PDMP not reviewed this encounter. Orders Placed This Encounter  Procedures   DG Cervical Spine 2 or 3 views    Standing  Status:   Future    Number of Occurrences:   1    Standing Expiration Date:  12/23/2021    Order Specific Question:   Reason for Exam (SYMPTOM  OR DIAGNOSIS REQUIRED)    Answer:   radicular pain    Order Specific Question:   Preferred imaging location?    Answer:   [facility]   DG Lumbar Spine 2-3 Views    Standing Status:   Future    Number of Occurrences:   1    Standing Expiration Date:   12/23/2021    Order Specific Question:   Reason for Exam (SYMPTOM  OR DIAGNOSIS REQUIRED)    Answer:   radicular pain    Order Specific Question:   Preferred imaging location?    Answer:   [facility]   Ambulatory referral to Neurology    Referral Priority:   Routine    Referral Type:   Consultation    Referral Reason:   Specialty Services Required    Requested Specialty:   Neurology    Number of Visits Requested:   1   NCV with EMG(electromyography)    Standing Status:   Future    Standing Expiration Date:   12/23/2021    Order Specific Question:   Where should this test be performed?    Answer:   [facility]   Meds ordered this encounter  Medications   gabapentin (NEURONTIN) 300 MG capsule    Sig: Take 1 capsule (300 mg total) by mouth 3 (three) times daily as needed.    Dispense:  90 capsule    Refill:  3     Discussed warning signs or symptoms. Please see discharge instructions. Patient expresses understanding.   The above documentation has been reviewed and is accurate and complete Clementeen Graham, M.D.

## 2020-12-23 NOTE — Patient Instructions (Signed)
Thank you for coming in today.   Please get an Xray today before you leave   Nerve study.  Recheck with me in 6 weeks.   Cubital Tunnel Elbow brace at night.   Gabapentin at night as needed.

## 2020-12-26 NOTE — Progress Notes (Signed)
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

## 2020-12-26 NOTE — Progress Notes (Signed)
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  This was seen on your MRI in 2017.  X-ray is not significantly changed.

## 2020-12-28 ENCOUNTER — Telehealth: Payer: Self-pay | Admitting: Family Medicine

## 2020-12-28 ENCOUNTER — Other Ambulatory Visit: Payer: Self-pay

## 2020-12-28 DIAGNOSIS — R202 Paresthesia of skin: Secondary | ICD-10-CM

## 2020-12-28 DIAGNOSIS — M79609 Pain in unspecified limb: Secondary | ICD-10-CM

## 2020-12-28 NOTE — Telephone Encounter (Signed)
Switching location to [facility] neurology.  Patient was seen 5 years ago briefly at [facility] for an unrelated issue.  We will use [facility] for an EMG.

## 2020-12-29 ENCOUNTER — Ambulatory Visit (INDEPENDENT_AMBULATORY_CARE_PROVIDER_SITE_OTHER): Payer: BC Managed Care – PPO | Admitting: Neurology

## 2020-12-29 ENCOUNTER — Other Ambulatory Visit: Payer: Self-pay

## 2020-12-29 ENCOUNTER — Encounter: Payer: BC Managed Care – PPO | Admitting: Neurology

## 2020-12-29 DIAGNOSIS — R202 Paresthesia of skin: Secondary | ICD-10-CM

## 2020-12-29 DIAGNOSIS — M79609 Pain in unspecified limb: Secondary | ICD-10-CM | POA: Diagnosis not present

## 2020-12-29 NOTE — Procedures (Signed)
[facility]  [address], [address]  [place], [place] [address] Tel: [phone] Fax:  [phone] Test Date:  12/29/2020  Patient: Alexander Aguirre DOB: [birth year] Physician: Nita Sickle, DO  Sex: Male Height: 5\' 7"  Ref Phys: , M.D.  ID#: Clementeen Graham   Technician:    Patient Complaints: This is a 45 year old man referred for evaluation of left upper and lower extremity paresthesias.  NCV & EMG Findings: Extensive electrodiagnostic testing of the left upper and lower extremity shows: All sensory responses including the left median, ulnar, mixed palmar, sural, and superficial peroneal nerves are within normal limits. All motor responses including the left median, ulnar, peroneal, and tibial nerves are within normal limits. Left tibial H reflex study is within normal limits. There is no evidence of active or chronic motor axonal loss changes affecting any of the tested muscles.  Motor unit configuration and recruitment pattern is within normal limits.  Impression: This is a normal study of the left upper and lower extremities.  In particular, there is no evidence of a sensorimotor polyneuropathy, carpal tunnel syndrome, or cervical/lumbosacral radiculopathy.   ___________________________ 57, DO    Nerve Conduction Studies Anti Sensory Summary Table   Stim Site NR Peak (ms) Norm Peak (ms) P-T Amp (V) Norm P-T Amp  Left Median Anti Sensory (2nd Digit)  32C  Wrist    2.8 <3.4 64.3 >20  Left Sup Peroneal Anti Sensory (Ant Lat Mall)  32C  12 cm    2.1 <4.5 16.3 >5  Left Sural Anti Sensory (Lat Mall)  32C  Calf    2.5 <4.5 26.8 >5  Left Ulnar Anti Sensory (5th Digit)  32C  Wrist    2.8 <3.1 47.7 >12   Motor Summary Table   Stim Site NR Onset (ms) Norm Onset (ms) O-P Amp (mV) Norm O-P Amp Site1 Site2 Delta-0 (ms) Dist (cm) Vel (m/s) Norm Vel (m/s)  Left Median Motor (Abd Poll Brev)  32C  Wrist    2.7 <3.9 11.2 >6 Elbow Wrist 4.6 27.0 59 >50   Elbow    7.3  10.7         Left Peroneal Motor (Ext Dig Brev)  32C  Ankle    3.5 <5.5 5.2 >3 B Fib Ankle 6.9 38.0 55 >40  B Fib    10.4  4.8  Poplt B Fib 1.4 8.0 57 >40  Poplt    11.8  4.7         Left Tibial Motor (Abd Hall Brev)  32C  Ankle    2.9 <6.0 15.2 >8 Knee Ankle 7.9 43.0 54 >40  Knee    10.8  11.2         Left Ulnar Motor (Abd Dig Minimi)  32C  Wrist    2.3 <3.1 9.1 >7 B Elbow Wrist 3.4 21.0 62 >50  B Elbow    5.7  8.8  A Elbow B Elbow 1.8 10.0 56 >50  A Elbow    7.5  8.6          Comparison Summary Table   Stim Site NR Peak (ms) Norm Peak (ms) P-T Amp (V) Site1 Site2 Delta-P (ms) Norm Delta (ms)  Left Median/Ulnar Palm Comparison (Wrist - 8cm)  32C  Median Palm    1.5 <2.2 49.0 Median Palm Ulnar Palm 0.1   Ulnar Palm    1.6 <2.2 12.4       H Reflex Studies   NR H-Lat (ms) Lat Norm (  ms) L-R H-Lat (ms)  Left Tibial (Gastroc)  32C     29.80 <35    EMG   Side Muscle Ins Act Fibs Psw Fasc Number Recrt Dur Dur. Amp Amp. Poly Poly. Comment  Left AntTibialis Nml Nml Nml Nml Nml Nml Nml Nml Nml Nml Nml Nml N/A  Left Gastroc Nml Nml Nml Nml Nml Nml Nml Nml Nml Nml Nml Nml N/A  Left Flex Dig Long Nml Nml Nml Nml Nml Nml Nml Nml Nml Nml Nml Nml N/A  Left RectFemoris Nml Nml Nml Nml Nml Nml Nml Nml Nml Nml Nml Nml N/A  Left GluteusMed Nml Nml Nml Nml Nml Nml Nml Nml Nml Nml Nml Nml N/A  Left 1stDorInt Nml Nml Nml Nml Nml Nml Nml Nml Nml Nml Nml Nml N/A  Left PronatorTeres Nml Nml Nml Nml Nml Nml Nml Nml Nml Nml Nml Nml N/A  Left Biceps Nml Nml Nml Nml Nml Nml Nml Nml Nml Nml Nml Nml N/A  Left Triceps Nml Nml Nml Nml Nml Nml Nml Nml Nml Nml Nml Nml N/A  Left Deltoid Nml Nml Nml Nml Nml Nml Nml Nml Nml Nml Nml Nml N/A      Waveforms:

## 2021-01-04 NOTE — Progress Notes (Signed)
Nerve conduction study is normal. Nerve conduction study can sometimes miss mild disease which is a possibility here.  Reasonable to return sooner than scheduled on January 18 discuss next steps and next treatment options.

## 2021-01-31 NOTE — Progress Notes (Signed)
I, Alexander Aguirre, LAT, ATC, am serving as scribe for Dr. Clementeen GrahamEvan Kitti Aguirre.  Alexander Aguirre is a 46 y.o. male who [facility] today for f/u of B hand and L foot paresthesias.  He was last seen by Dr. [name] on 12/23/20 and was referred for a NCV/EMG and prescribed Gabapentin.  He was also advised to wear a cubital tunnel elbow brace at night.  Today, pt reports that he con't to have symptoms but notes that they are less than previously.  He con't to have paresthesias in B hands, L>R, and L foot.  He states that he feels like the Gabapentin is helping as he stopped taking the medication x one week and noted increased symptoms when he was off the medication.  He has been wearing his L elbow night splint.  He does note that positioning tends to effect his symptoms, reporting that sitting worsens his L foot paresthesias and R forearm/wrist supination decreases his R hand paresthesias.  He has done some thinking and is wondering if his elliptical machine may be causing some of his paresthesias.  He thinks this worsened when he started using the elliptical.  He grips of the handles of the elliptical and pushes and pulls them pretty hard which he thinks may be exacerbating his paresthesias.  He has changed his grip on the elliptical machine which he thinks has helped a little.  Diagnostic testing: UE NCV/EMG- 12/29/20; L-spine and C-spine XR- 12/23/20  Pertinent review of systems: No fevers or chills  Relevant historical information: Prior work-up for possible MS in 2017 with normal MRI brain, T-spine, and lumbar spine by neurology.   Exam:  BP 100/74 (BP Location: Left Arm, Patient Position: Sitting, Cuff Size: Normal)    Pulse 75    Ht 5\' 7"  (1.702 m)    Wt 175 lb 9.6 oz (79.7 kg)    SpO2 95%    BMI 27.50 kg/m  General: Well Developed, well nourished, and in no acute distress.   MSK:  Intact strength and reflexes upper and lower extremities.    Lab and Radiology  Results EXAM: CERVICAL SPINE - 2-3 VIEW   COMPARISON:  None.   FINDINGS: No recent fracture is seen. Alignment of posterior margins of vertebral bodies is unremarkable. There is calcification in the anterior spinal ligament at C4-C5 and C5-C6 levels. 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 Evaluation of neural foramina is limited without oblique views. Prevertebral soft tissues are unremarkable.   IMPRESSION: No recent fracture is seen in the cervical spine. Degenerative changes are noted with anterior bony spurs C4-C5 and C5-C6 levels.     Electronically Signed   By: Ernie AvenaPalani  Rathinasamy M.D.   On: 12/23/2020 15:51 I, Alexander Aguirre, personally (independently) visualized and performed the interpretation of the images attached in this note.  EMG result December 29, 2020 Patient Complaints: This is a 46 year old man referred for evaluation of left upper and lower extremity paresthesias.   NCV & EMG Findings: Extensive electrodiagnostic testing of the left upper and lower extremity shows: All sensory responses including the left median, ulnar, mixed palmar, sural, and superficial peroneal nerves are within normal limits. All motor responses including the left median, ulnar, peroneal, and tibial nerves are within normal limits. Left tibial H reflex study is within normal limits. There is no evidence of active or chronic motor axonal loss changes affecting any of the tested muscles.  Motor unit configuration and recruitment  pattern is within normal limits.   Impression: This is a normal study of the left upper and lower extremities.  In particular, there is no evidence of a sensorimotor polyneuropathy, carpal tunnel syndrome, or cervical/lumbosacral radiculopathy.     ___________________________ Nita Sickle, DO  Component     Latest Ref Rng & Units 12/21/2020  Sodium     135 - 145 mEq/L 139  Potassium     3.5 - 5.1 mEq/L 4.4  Chloride     96 -  112 mEq/L 103  CO2     19 - 32 mEq/L 30  Glucose     70 - 99 mg/dL 84  BUN     6 - 23 mg/dL 11  Creatinine     6.01 - 1.50 mg/dL 0.93  Total Bilirubin     0.2 - 1.2 mg/dL 1.0  Alkaline Phosphatase     39 - 117 U/L 55  AST     0 - 37 U/L 20  ALT     0 - 53 U/L 13  Total Protein     6.0 - 8.3 g/dL 7.1  Albumin     3.5 - 5.2 g/dL 4.3  GFR     >23.55 mL/min 80.35  Calcium     8.4 - 10.5 mg/dL 9.4  WBC     4.0 - 73.2 K/uL 5.3  RBC     4.22 - 5.81 Mil/uL 5.20  Platelets     150.0 - 400.0 K/uL 270.0  Hemoglobin     13.0 - 17.0 g/dL 20.2  HCT     54.2 - 70.6 % 43.2  MCV     78.0 - 100.0 fl 83.0  MCHC     30.0 - 36.0 g/dL 23.7  RDW     62.8 - 31.5 % 13.9  Vitamin B12     211 - 911 pg/mL 634  Hemoglobin A1C     4.6 - 6.5 % 6.2  TSH     0.35 - 5.50 uIU/mL 2.13     Assessment and Plan: 46 y.o. male with  Bilateral upper and lower paresthesias left upper extremity is worse.  Etiology is unclear at this point.  He has had a pretty extensive work-up with normal metabolic lab work-up, and normal nerve conduction study left arm.  At this point it is likely that he does not have a severe neurologic problem but there still could be some explanation for his paresthesia.  His grip with his elliptical could be causing pronator syndrome which could cause some of the paresthesias symptoms that he is experiencing.  Also possibility is experiencing some exertional compartment syndrome in his upper or lower extremities on the treadmill or elliptical machine.  Both of these could potentially be missed by nerve conduction study and would result in normal labs.  However a cervical spine issue could explain possibly his issues.  He does have some degenerative changes on his x-ray.  We will proceed with MRI C-spine.  Plan to continue gabapentin for now.  He asked if a chiropractor would help.  I do not know if it would help or not but it might be reasonable especially if no severe spinal  stenosis or spinal cord compression is seen on MRI C-spine.   PDMP not reviewed this encounter. Orders Placed This Encounter  Procedures   MR Cervical Spine Wo Contrast    Standing Status:   Future    Standing Expiration Date:   02/01/2022    Order  Specific Question:   What is the patient's sedation requirement?    Answer:   No Sedation    Order Specific Question:   Does the patient have a pacemaker or implanted devices?    Answer:   No    Order Specific Question:   Preferred imaging location?    Answer:   [facility] (table limit-350lbs)   No orders of the defined types were placed in this encounter.    Discussed warning signs or symptoms. Please see discharge instructions. Patient expresses understanding.   The above documentation has been reviewed and is accurate and complete Alexander Graham, M.D.  Total encounter time 30 minutes including face-to-face time with the patient and, reviewing past medical record, and charting on the date of service.   Reviewed findings discussed treatment plan and options going forward.

## 2021-02-01 ENCOUNTER — Ambulatory Visit (INDEPENDENT_AMBULATORY_CARE_PROVIDER_SITE_OTHER): Payer: BC Managed Care – PPO | Admitting: Family Medicine

## 2021-02-01 ENCOUNTER — Other Ambulatory Visit: Payer: Self-pay

## 2021-02-01 ENCOUNTER — Encounter: Payer: Self-pay | Admitting: Family Medicine

## 2021-02-01 VITALS — BP 100/74 | HR 75 | Ht 67.0 in | Wt 175.6 lb

## 2021-02-01 DIAGNOSIS — R202 Paresthesia of skin: Secondary | ICD-10-CM | POA: Diagnosis not present

## 2021-02-01 DIAGNOSIS — M79609 Pain in unspecified limb: Secondary | ICD-10-CM

## 2021-02-01 NOTE — Patient Instructions (Addendum)
Good to see you today.  I've ordered an MRI of your c-spine.  Con't w/ gabapentin  Dr. Ernesta Amble w/ [facility].  [phone]  Follow-up: as needed depending on MRI results

## 2021-02-06 ENCOUNTER — Ambulatory Visit (INDEPENDENT_AMBULATORY_CARE_PROVIDER_SITE_OTHER): Payer: BC Managed Care – PPO

## 2021-02-06 ENCOUNTER — Other Ambulatory Visit: Payer: Self-pay

## 2021-02-06 DIAGNOSIS — R202 Paresthesia of skin: Secondary | ICD-10-CM

## 2021-02-06 DIAGNOSIS — M79609 Pain in unspecified limb: Secondary | ICD-10-CM

## 2021-02-06 DIAGNOSIS — M4802 Spinal stenosis, cervical region: Secondary | ICD-10-CM | POA: Diagnosis not present

## 2021-02-06 DIAGNOSIS — M47812 Spondylosis without myelopathy or radiculopathy, cervical region: Secondary | ICD-10-CM | POA: Diagnosis not present

## 2021-02-06 DIAGNOSIS — M2578 Osteophyte, vertebrae: Secondary | ICD-10-CM | POA: Diagnosis not present

## 2021-02-08 ENCOUNTER — Encounter: Payer: Self-pay | Admitting: Family Medicine

## 2021-02-08 DIAGNOSIS — M5412 Radiculopathy, cervical region: Secondary | ICD-10-CM

## 2021-02-08 DIAGNOSIS — R202 Paresthesia of skin: Secondary | ICD-10-CM

## 2021-02-08 DIAGNOSIS — M79609 Pain in unspecified limb: Secondary | ICD-10-CM

## 2021-02-08 NOTE — Progress Notes (Signed)
MRI of the cervical spine shows potential for pinched nerve that could cause some thumb numbness on the right. There is also a little bit of spinal stenosis at C6-7.  There is no significant spinal cord compression which could cause upper and lower extremity numbness. I could arrange for an injection in your neck which might help. I think trying to modify your grip with the elliptical is a good idea for now.  Let me know what you would like to proceed with.  If you would like to discuss this further we can arrange for a phone call to save you an office visit

## 2021-02-13 NOTE — Telephone Encounter (Signed)
I called Alexander Aguirre back.  Plan for ESI.  He could also have Thoracic outlet syndrome.  If injection does not help would consider PT or Chiropractor.

## 2021-02-14 DIAGNOSIS — M9902 Segmental and somatic dysfunction of thoracic region: Secondary | ICD-10-CM | POA: Diagnosis not present

## 2021-02-14 DIAGNOSIS — M9901 Segmental and somatic dysfunction of cervical region: Secondary | ICD-10-CM | POA: Diagnosis not present

## 2021-02-14 DIAGNOSIS — M50122 Cervical disc disorder at C5-C6 level with radiculopathy: Secondary | ICD-10-CM | POA: Diagnosis not present

## 2021-02-14 DIAGNOSIS — M50323 Other cervical disc degeneration at C6-C7 level: Secondary | ICD-10-CM | POA: Diagnosis not present

## 2021-02-15 DIAGNOSIS — M9902 Segmental and somatic dysfunction of thoracic region: Secondary | ICD-10-CM | POA: Diagnosis not present

## 2021-02-15 DIAGNOSIS — M50323 Other cervical disc degeneration at C6-C7 level: Secondary | ICD-10-CM | POA: Diagnosis not present

## 2021-02-15 DIAGNOSIS — M50122 Cervical disc disorder at C5-C6 level with radiculopathy: Secondary | ICD-10-CM | POA: Diagnosis not present

## 2021-02-15 DIAGNOSIS — M9901 Segmental and somatic dysfunction of cervical region: Secondary | ICD-10-CM | POA: Diagnosis not present

## 2021-02-16 DIAGNOSIS — M9902 Segmental and somatic dysfunction of thoracic region: Secondary | ICD-10-CM | POA: Diagnosis not present

## 2021-02-16 DIAGNOSIS — M50323 Other cervical disc degeneration at C6-C7 level: Secondary | ICD-10-CM | POA: Diagnosis not present

## 2021-02-16 DIAGNOSIS — M9901 Segmental and somatic dysfunction of cervical region: Secondary | ICD-10-CM | POA: Diagnosis not present

## 2021-02-16 DIAGNOSIS — M50122 Cervical disc disorder at C5-C6 level with radiculopathy: Secondary | ICD-10-CM | POA: Diagnosis not present

## 2021-02-20 DIAGNOSIS — M50323 Other cervical disc degeneration at C6-C7 level: Secondary | ICD-10-CM | POA: Diagnosis not present

## 2021-02-20 DIAGNOSIS — M9901 Segmental and somatic dysfunction of cervical region: Secondary | ICD-10-CM | POA: Diagnosis not present

## 2021-02-20 DIAGNOSIS — M50122 Cervical disc disorder at C5-C6 level with radiculopathy: Secondary | ICD-10-CM | POA: Diagnosis not present

## 2021-02-20 DIAGNOSIS — M9902 Segmental and somatic dysfunction of thoracic region: Secondary | ICD-10-CM | POA: Diagnosis not present

## 2021-02-21 DIAGNOSIS — M9902 Segmental and somatic dysfunction of thoracic region: Secondary | ICD-10-CM | POA: Diagnosis not present

## 2021-02-21 DIAGNOSIS — M50323 Other cervical disc degeneration at C6-C7 level: Secondary | ICD-10-CM | POA: Diagnosis not present

## 2021-02-21 DIAGNOSIS — M9901 Segmental and somatic dysfunction of cervical region: Secondary | ICD-10-CM | POA: Diagnosis not present

## 2021-02-21 DIAGNOSIS — M50122 Cervical disc disorder at C5-C6 level with radiculopathy: Secondary | ICD-10-CM | POA: Diagnosis not present

## 2021-02-23 DIAGNOSIS — M9901 Segmental and somatic dysfunction of cervical region: Secondary | ICD-10-CM | POA: Diagnosis not present

## 2021-02-23 DIAGNOSIS — M50122 Cervical disc disorder at C5-C6 level with radiculopathy: Secondary | ICD-10-CM | POA: Diagnosis not present

## 2021-02-23 DIAGNOSIS — M9902 Segmental and somatic dysfunction of thoracic region: Secondary | ICD-10-CM | POA: Diagnosis not present

## 2021-02-23 DIAGNOSIS — M50323 Other cervical disc degeneration at C6-C7 level: Secondary | ICD-10-CM | POA: Diagnosis not present

## 2021-02-27 DIAGNOSIS — M9901 Segmental and somatic dysfunction of cervical region: Secondary | ICD-10-CM | POA: Diagnosis not present

## 2021-02-27 DIAGNOSIS — M9902 Segmental and somatic dysfunction of thoracic region: Secondary | ICD-10-CM | POA: Diagnosis not present

## 2021-02-27 DIAGNOSIS — M50122 Cervical disc disorder at C5-C6 level with radiculopathy: Secondary | ICD-10-CM | POA: Diagnosis not present

## 2021-02-27 DIAGNOSIS — M50323 Other cervical disc degeneration at C6-C7 level: Secondary | ICD-10-CM | POA: Diagnosis not present

## 2021-02-28 DIAGNOSIS — M9902 Segmental and somatic dysfunction of thoracic region: Secondary | ICD-10-CM | POA: Diagnosis not present

## 2021-02-28 DIAGNOSIS — M50122 Cervical disc disorder at C5-C6 level with radiculopathy: Secondary | ICD-10-CM | POA: Diagnosis not present

## 2021-02-28 DIAGNOSIS — M50323 Other cervical disc degeneration at C6-C7 level: Secondary | ICD-10-CM | POA: Diagnosis not present

## 2021-02-28 DIAGNOSIS — M9901 Segmental and somatic dysfunction of cervical region: Secondary | ICD-10-CM | POA: Diagnosis not present

## 2021-03-02 DIAGNOSIS — M50122 Cervical disc disorder at C5-C6 level with radiculopathy: Secondary | ICD-10-CM | POA: Diagnosis not present

## 2021-03-02 DIAGNOSIS — M9902 Segmental and somatic dysfunction of thoracic region: Secondary | ICD-10-CM | POA: Diagnosis not present

## 2021-03-02 DIAGNOSIS — M9901 Segmental and somatic dysfunction of cervical region: Secondary | ICD-10-CM | POA: Diagnosis not present

## 2021-03-02 DIAGNOSIS — M50323 Other cervical disc degeneration at C6-C7 level: Secondary | ICD-10-CM | POA: Diagnosis not present

## 2021-03-06 DIAGNOSIS — M50122 Cervical disc disorder at C5-C6 level with radiculopathy: Secondary | ICD-10-CM | POA: Diagnosis not present

## 2021-03-06 DIAGNOSIS — M9902 Segmental and somatic dysfunction of thoracic region: Secondary | ICD-10-CM | POA: Diagnosis not present

## 2021-03-06 DIAGNOSIS — M50323 Other cervical disc degeneration at C6-C7 level: Secondary | ICD-10-CM | POA: Diagnosis not present

## 2021-03-06 DIAGNOSIS — M9901 Segmental and somatic dysfunction of cervical region: Secondary | ICD-10-CM | POA: Diagnosis not present

## 2021-03-07 DIAGNOSIS — M9901 Segmental and somatic dysfunction of cervical region: Secondary | ICD-10-CM | POA: Diagnosis not present

## 2021-03-07 DIAGNOSIS — M50323 Other cervical disc degeneration at C6-C7 level: Secondary | ICD-10-CM | POA: Diagnosis not present

## 2021-03-07 DIAGNOSIS — M50122 Cervical disc disorder at C5-C6 level with radiculopathy: Secondary | ICD-10-CM | POA: Diagnosis not present

## 2021-03-07 DIAGNOSIS — M9902 Segmental and somatic dysfunction of thoracic region: Secondary | ICD-10-CM | POA: Diagnosis not present

## 2021-03-09 DIAGNOSIS — M50122 Cervical disc disorder at C5-C6 level with radiculopathy: Secondary | ICD-10-CM | POA: Diagnosis not present

## 2021-03-09 DIAGNOSIS — M9901 Segmental and somatic dysfunction of cervical region: Secondary | ICD-10-CM | POA: Diagnosis not present

## 2021-03-09 DIAGNOSIS — M9902 Segmental and somatic dysfunction of thoracic region: Secondary | ICD-10-CM | POA: Diagnosis not present

## 2021-03-09 DIAGNOSIS — M50323 Other cervical disc degeneration at C6-C7 level: Secondary | ICD-10-CM | POA: Diagnosis not present

## 2021-03-13 DIAGNOSIS — M9902 Segmental and somatic dysfunction of thoracic region: Secondary | ICD-10-CM | POA: Diagnosis not present

## 2021-03-13 DIAGNOSIS — M50323 Other cervical disc degeneration at C6-C7 level: Secondary | ICD-10-CM | POA: Diagnosis not present

## 2021-03-13 DIAGNOSIS — M9901 Segmental and somatic dysfunction of cervical region: Secondary | ICD-10-CM | POA: Diagnosis not present

## 2021-03-13 DIAGNOSIS — M50122 Cervical disc disorder at C5-C6 level with radiculopathy: Secondary | ICD-10-CM | POA: Diagnosis not present

## 2021-03-14 DIAGNOSIS — M50122 Cervical disc disorder at C5-C6 level with radiculopathy: Secondary | ICD-10-CM | POA: Diagnosis not present

## 2021-03-14 DIAGNOSIS — M50323 Other cervical disc degeneration at C6-C7 level: Secondary | ICD-10-CM | POA: Diagnosis not present

## 2021-03-14 DIAGNOSIS — M9902 Segmental and somatic dysfunction of thoracic region: Secondary | ICD-10-CM | POA: Diagnosis not present

## 2021-03-14 DIAGNOSIS — M9901 Segmental and somatic dysfunction of cervical region: Secondary | ICD-10-CM | POA: Diagnosis not present

## 2021-03-16 DIAGNOSIS — M9902 Segmental and somatic dysfunction of thoracic region: Secondary | ICD-10-CM | POA: Diagnosis not present

## 2021-03-16 DIAGNOSIS — M9901 Segmental and somatic dysfunction of cervical region: Secondary | ICD-10-CM | POA: Diagnosis not present

## 2021-03-16 DIAGNOSIS — M50323 Other cervical disc degeneration at C6-C7 level: Secondary | ICD-10-CM | POA: Diagnosis not present

## 2021-03-16 DIAGNOSIS — M50122 Cervical disc disorder at C5-C6 level with radiculopathy: Secondary | ICD-10-CM | POA: Diagnosis not present

## 2021-03-20 DIAGNOSIS — M9902 Segmental and somatic dysfunction of thoracic region: Secondary | ICD-10-CM | POA: Diagnosis not present

## 2021-03-20 DIAGNOSIS — M9901 Segmental and somatic dysfunction of cervical region: Secondary | ICD-10-CM | POA: Diagnosis not present

## 2021-03-20 DIAGNOSIS — M50122 Cervical disc disorder at C5-C6 level with radiculopathy: Secondary | ICD-10-CM | POA: Diagnosis not present

## 2021-03-20 DIAGNOSIS — M50323 Other cervical disc degeneration at C6-C7 level: Secondary | ICD-10-CM | POA: Diagnosis not present

## 2021-03-22 DIAGNOSIS — M9902 Segmental and somatic dysfunction of thoracic region: Secondary | ICD-10-CM | POA: Diagnosis not present

## 2021-03-22 DIAGNOSIS — M50323 Other cervical disc degeneration at C6-C7 level: Secondary | ICD-10-CM | POA: Diagnosis not present

## 2021-03-22 DIAGNOSIS — M9901 Segmental and somatic dysfunction of cervical region: Secondary | ICD-10-CM | POA: Diagnosis not present

## 2021-03-22 DIAGNOSIS — M50122 Cervical disc disorder at C5-C6 level with radiculopathy: Secondary | ICD-10-CM | POA: Diagnosis not present

## 2021-03-27 DIAGNOSIS — M50323 Other cervical disc degeneration at C6-C7 level: Secondary | ICD-10-CM | POA: Diagnosis not present

## 2021-03-27 DIAGNOSIS — M9901 Segmental and somatic dysfunction of cervical region: Secondary | ICD-10-CM | POA: Diagnosis not present

## 2021-03-27 DIAGNOSIS — M50122 Cervical disc disorder at C5-C6 level with radiculopathy: Secondary | ICD-10-CM | POA: Diagnosis not present

## 2021-03-27 DIAGNOSIS — M9902 Segmental and somatic dysfunction of thoracic region: Secondary | ICD-10-CM | POA: Diagnosis not present

## 2021-03-29 DIAGNOSIS — M50122 Cervical disc disorder at C5-C6 level with radiculopathy: Secondary | ICD-10-CM | POA: Diagnosis not present

## 2021-03-29 DIAGNOSIS — M9902 Segmental and somatic dysfunction of thoracic region: Secondary | ICD-10-CM | POA: Diagnosis not present

## 2021-03-29 DIAGNOSIS — M9901 Segmental and somatic dysfunction of cervical region: Secondary | ICD-10-CM | POA: Diagnosis not present

## 2021-03-29 DIAGNOSIS — M50323 Other cervical disc degeneration at C6-C7 level: Secondary | ICD-10-CM | POA: Diagnosis not present

## 2021-04-03 DIAGNOSIS — M50323 Other cervical disc degeneration at C6-C7 level: Secondary | ICD-10-CM | POA: Diagnosis not present

## 2021-04-03 DIAGNOSIS — M9901 Segmental and somatic dysfunction of cervical region: Secondary | ICD-10-CM | POA: Diagnosis not present

## 2021-04-03 DIAGNOSIS — M9902 Segmental and somatic dysfunction of thoracic region: Secondary | ICD-10-CM | POA: Diagnosis not present

## 2021-04-03 DIAGNOSIS — M50122 Cervical disc disorder at C5-C6 level with radiculopathy: Secondary | ICD-10-CM | POA: Diagnosis not present

## 2021-04-10 DIAGNOSIS — M9902 Segmental and somatic dysfunction of thoracic region: Secondary | ICD-10-CM | POA: Diagnosis not present

## 2021-04-10 DIAGNOSIS — M50323 Other cervical disc degeneration at C6-C7 level: Secondary | ICD-10-CM | POA: Diagnosis not present

## 2021-04-10 DIAGNOSIS — M50122 Cervical disc disorder at C5-C6 level with radiculopathy: Secondary | ICD-10-CM | POA: Diagnosis not present

## 2021-04-10 DIAGNOSIS — M9901 Segmental and somatic dysfunction of cervical region: Secondary | ICD-10-CM | POA: Diagnosis not present

## 2021-04-12 DIAGNOSIS — M9902 Segmental and somatic dysfunction of thoracic region: Secondary | ICD-10-CM | POA: Diagnosis not present

## 2021-04-12 DIAGNOSIS — M50323 Other cervical disc degeneration at C6-C7 level: Secondary | ICD-10-CM | POA: Diagnosis not present

## 2021-04-12 DIAGNOSIS — M50122 Cervical disc disorder at C5-C6 level with radiculopathy: Secondary | ICD-10-CM | POA: Diagnosis not present

## 2021-04-12 DIAGNOSIS — M9901 Segmental and somatic dysfunction of cervical region: Secondary | ICD-10-CM | POA: Diagnosis not present

## 2021-04-17 DIAGNOSIS — M9901 Segmental and somatic dysfunction of cervical region: Secondary | ICD-10-CM | POA: Diagnosis not present

## 2021-04-17 DIAGNOSIS — M9902 Segmental and somatic dysfunction of thoracic region: Secondary | ICD-10-CM | POA: Diagnosis not present

## 2021-04-17 DIAGNOSIS — M50122 Cervical disc disorder at C5-C6 level with radiculopathy: Secondary | ICD-10-CM | POA: Diagnosis not present

## 2021-04-17 DIAGNOSIS — M50323 Other cervical disc degeneration at C6-C7 level: Secondary | ICD-10-CM | POA: Diagnosis not present

## 2021-04-19 DIAGNOSIS — M9902 Segmental and somatic dysfunction of thoracic region: Secondary | ICD-10-CM | POA: Diagnosis not present

## 2021-04-19 DIAGNOSIS — M50122 Cervical disc disorder at C5-C6 level with radiculopathy: Secondary | ICD-10-CM | POA: Diagnosis not present

## 2021-04-19 DIAGNOSIS — M9901 Segmental and somatic dysfunction of cervical region: Secondary | ICD-10-CM | POA: Diagnosis not present

## 2021-04-19 DIAGNOSIS — M50323 Other cervical disc degeneration at C6-C7 level: Secondary | ICD-10-CM | POA: Diagnosis not present

## 2021-04-25 DIAGNOSIS — M9901 Segmental and somatic dysfunction of cervical region: Secondary | ICD-10-CM | POA: Diagnosis not present

## 2021-04-25 DIAGNOSIS — M50323 Other cervical disc degeneration at C6-C7 level: Secondary | ICD-10-CM | POA: Diagnosis not present

## 2021-04-25 DIAGNOSIS — M9902 Segmental and somatic dysfunction of thoracic region: Secondary | ICD-10-CM | POA: Diagnosis not present

## 2021-04-25 DIAGNOSIS — M50122 Cervical disc disorder at C5-C6 level with radiculopathy: Secondary | ICD-10-CM | POA: Diagnosis not present

## 2021-04-27 DIAGNOSIS — M9902 Segmental and somatic dysfunction of thoracic region: Secondary | ICD-10-CM | POA: Diagnosis not present

## 2021-04-27 DIAGNOSIS — M9901 Segmental and somatic dysfunction of cervical region: Secondary | ICD-10-CM | POA: Diagnosis not present

## 2021-04-27 DIAGNOSIS — M50122 Cervical disc disorder at C5-C6 level with radiculopathy: Secondary | ICD-10-CM | POA: Diagnosis not present

## 2021-04-27 DIAGNOSIS — M50323 Other cervical disc degeneration at C6-C7 level: Secondary | ICD-10-CM | POA: Diagnosis not present

## 2021-05-02 DIAGNOSIS — M50122 Cervical disc disorder at C5-C6 level with radiculopathy: Secondary | ICD-10-CM | POA: Diagnosis not present

## 2021-05-02 DIAGNOSIS — M50323 Other cervical disc degeneration at C6-C7 level: Secondary | ICD-10-CM | POA: Diagnosis not present

## 2021-05-02 DIAGNOSIS — M9901 Segmental and somatic dysfunction of cervical region: Secondary | ICD-10-CM | POA: Diagnosis not present

## 2021-05-02 DIAGNOSIS — M9902 Segmental and somatic dysfunction of thoracic region: Secondary | ICD-10-CM | POA: Diagnosis not present

## 2021-05-09 DIAGNOSIS — M9901 Segmental and somatic dysfunction of cervical region: Secondary | ICD-10-CM | POA: Diagnosis not present

## 2021-05-09 DIAGNOSIS — M50323 Other cervical disc degeneration at C6-C7 level: Secondary | ICD-10-CM | POA: Diagnosis not present

## 2021-05-09 DIAGNOSIS — M50122 Cervical disc disorder at C5-C6 level with radiculopathy: Secondary | ICD-10-CM | POA: Diagnosis not present

## 2021-05-09 DIAGNOSIS — M9902 Segmental and somatic dysfunction of thoracic region: Secondary | ICD-10-CM | POA: Diagnosis not present

## 2021-05-16 DIAGNOSIS — M9901 Segmental and somatic dysfunction of cervical region: Secondary | ICD-10-CM | POA: Diagnosis not present

## 2021-05-16 DIAGNOSIS — M50122 Cervical disc disorder at C5-C6 level with radiculopathy: Secondary | ICD-10-CM | POA: Diagnosis not present

## 2021-05-16 DIAGNOSIS — M9902 Segmental and somatic dysfunction of thoracic region: Secondary | ICD-10-CM | POA: Diagnosis not present

## 2021-05-16 DIAGNOSIS — M50323 Other cervical disc degeneration at C6-C7 level: Secondary | ICD-10-CM | POA: Diagnosis not present

## 2021-05-23 DIAGNOSIS — M9901 Segmental and somatic dysfunction of cervical region: Secondary | ICD-10-CM | POA: Diagnosis not present

## 2021-05-23 DIAGNOSIS — M50122 Cervical disc disorder at C5-C6 level with radiculopathy: Secondary | ICD-10-CM | POA: Diagnosis not present

## 2021-05-23 DIAGNOSIS — M50323 Other cervical disc degeneration at C6-C7 level: Secondary | ICD-10-CM | POA: Diagnosis not present

## 2021-05-23 DIAGNOSIS — M9902 Segmental and somatic dysfunction of thoracic region: Secondary | ICD-10-CM | POA: Diagnosis not present

## 2021-06-17 ENCOUNTER — Other Ambulatory Visit (HOSPITAL_COMMUNITY): Payer: Self-pay

## 2021-06-17 ENCOUNTER — Telehealth: Payer: Self-pay | Admitting: Pharmacy Technician

## 2021-06-17 NOTE — Telephone Encounter (Signed)
Received notification from [facility] regarding a prior authorization for PANTOPRAZOLE 40MG . Authorization has been APPROVED from 6.3.23 to 6.1.24.   May take 48-72hrs to process at the pharmacy

## 2021-06-17 NOTE — Telephone Encounter (Signed)
Patient Advocate Encounter  Received notification from [employer] that prior authorization for PANTOPRAZOLE 40MG  is required.   PA submitted on 6.3.23 Key [id number]  Status is pending   [facility] will continue to follow  8.3.23, CPhT Patient Advocate Phone: [phone]

## 2021-07-10 ENCOUNTER — Encounter: Payer: BC Managed Care – PPO | Admitting: Family Medicine

## 2021-07-31 ENCOUNTER — Ambulatory Visit (INDEPENDENT_AMBULATORY_CARE_PROVIDER_SITE_OTHER): Payer: BC Managed Care – PPO | Admitting: Family Medicine

## 2021-07-31 ENCOUNTER — Encounter: Payer: Self-pay | Admitting: Family Medicine

## 2021-07-31 VITALS — BP 102/60 | HR 73 | Temp 97.8°F | Ht 67.0 in | Wt 160.2 lb

## 2021-07-31 DIAGNOSIS — G588 Other specified mononeuropathies: Secondary | ICD-10-CM

## 2021-07-31 DIAGNOSIS — R739 Hyperglycemia, unspecified: Secondary | ICD-10-CM | POA: Diagnosis not present

## 2021-07-31 DIAGNOSIS — M1990 Unspecified osteoarthritis, unspecified site: Secondary | ICD-10-CM

## 2021-07-31 DIAGNOSIS — M25551 Pain in right hip: Secondary | ICD-10-CM

## 2021-07-31 DIAGNOSIS — Z79899 Other long term (current) drug therapy: Secondary | ICD-10-CM

## 2021-07-31 DIAGNOSIS — K219 Gastro-esophageal reflux disease without esophagitis: Secondary | ICD-10-CM

## 2021-07-31 DIAGNOSIS — L309 Dermatitis, unspecified: Secondary | ICD-10-CM | POA: Diagnosis not present

## 2021-07-31 DIAGNOSIS — E785 Hyperlipidemia, unspecified: Secondary | ICD-10-CM

## 2021-07-31 DIAGNOSIS — Z0001 Encounter for general adult medical examination with abnormal findings: Secondary | ICD-10-CM

## 2021-07-31 DIAGNOSIS — M5412 Radiculopathy, cervical region: Secondary | ICD-10-CM

## 2021-07-31 LAB — COMPREHENSIVE METABOLIC PANEL
ALT: 9 U/L (ref 0–53)
AST: 14 U/L (ref 0–37)
Albumin: 4.6 g/dL (ref 3.5–5.2)
Alkaline Phosphatase: 51 U/L (ref 39–117)
BUN: 15 mg/dL (ref 6–23)
CO2: 27 mEq/L (ref 19–32)
Calcium: 9.6 mg/dL (ref 8.4–10.5)
Chloride: 102 mEq/L (ref 96–112)
Creatinine, Ser: 1.08 mg/dL (ref 0.40–1.50)
GFR: 83.57 mL/min (ref >60.00)
Glucose, Bld: 89 mg/dL (ref 70–99)
Potassium: 4.2 mEq/L (ref 3.5–5.1)
Sodium: 140 mEq/L (ref 135–145)
Total Bilirubin: 0.8 mg/dL (ref 0.2–1.2)
Total Protein: 7.2 g/dL (ref 6.0–8.3)

## 2021-07-31 LAB — CBC
HCT: 44.7 % (ref 39.0–52.0)
Hemoglobin: 14.7 g/dL (ref 13.0–17.0)
MCHC: 32.9 g/dL (ref 30.0–36.0)
MCV: 84.4 fl (ref 78.0–100.0)
Platelets: 252 10*3/uL (ref 150.0–400.0)
RBC: 5.3 Mil/uL (ref 4.22–5.81)
RDW: 14.3 % (ref 11.5–15.5)
WBC: 6.3 10*3/uL (ref 4.0–10.5)

## 2021-07-31 LAB — LIPID PANEL
Cholesterol: 231 mg/dL — ABNORMAL HIGH (ref 0–200)
HDL: 47.7 mg/dL (ref >39.00)
NonHDL: 183.42
Total CHOL/HDL Ratio: 5
Triglycerides: 297 mg/dL — ABNORMAL HIGH (ref 0.0–149.0)
VLDL: 59.4 mg/dL — ABNORMAL HIGH (ref 0.0–40.0)

## 2021-07-31 LAB — TSH: TSH: 2.23 u[IU]/mL (ref 0.35–5.50)

## 2021-07-31 LAB — HEMOGLOBIN A1C: Hgb A1c MFr Bld: 6 % (ref 4.6–6.5)

## 2021-07-31 LAB — LDL CHOLESTEROL, DIRECT: Direct LDL: 140 mg/dL

## 2021-07-31 LAB — VITAMIN B12: Vitamin B-12: 452 pg/mL (ref 211–911)

## 2021-07-31 MED ORDER — PANTOPRAZOLE SODIUM 40 MG PO TBEC: 40.0000 mg | DELAYED_RELEASE_TABLET | Freq: Every day | ORAL | 3 refills | Status: DC

## 2021-07-31 NOTE — Assessment & Plan Note (Signed)
Check A1c. 

## 2021-07-31 NOTE — Assessment & Plan Note (Signed)
Stable on OTC meds as needed. 

## 2021-07-31 NOTE — Assessment & Plan Note (Signed)
He is weaning down on Protonix.  We discussed potential food triggers.  We will refill Protonix today.

## 2021-07-31 NOTE — Assessment & Plan Note (Signed)
Symptoms are improving with chiropractor.  He will continue this.  He will let me know if he needs to be referred back to any other specialist.

## 2021-07-31 NOTE — Progress Notes (Signed)
Chief Complaint:  Alexander Aguirre is a 46 y.o. male who presents today for his annual comprehensive physical exam.    Assessment/Plan:  New/Acute Problems: Penile Paresthesias History consistent with dental neuralgia.  He had something similar several years ago that was treated with physical therapy.  We discussed treatment options.  We discussed urology referral, however he would like to avoid seeing the urologist for the time being if possible.  We will refer him to pelvic floor rehab.  He will let me know if not proving and we can refer to urology at that point.  Chronic Problems Addressed Today: Osteoarthritis Stable on OTC meds as needed.   Eczema Rash on left superior orbit consistent with mild eczema flare.  He can use topical hydrocortisone cream.  He will let me know if not improving and we can refer to dermatology.  Cervical radiculopathy Symptoms are improving with chiropractor.  He will continue this.  He will let me know if he needs to be referred back to any other specialist.  GERD (gastroesophageal reflux disease) He is weaning down on Protonix.  We discussed potential food triggers.  We will refill Protonix today.  Hyperglycemia Check A1c.  Dyslipidemia Check lipids.   Preventative Healthcare: Check labs.    Patient Counseling(The following topics were reviewed and/or handout was given):  -Nutrition: Stressed importance of moderation in sodium/caffeine intake, saturated fat and cholesterol, caloric balance, sufficient intake of fresh fruits, vegetables, and fiber.  -Stressed the importance of regular exercise.   -Substance Abuse: Discussed cessation/primary prevention of tobacco, alcohol, or other drug use; driving or other dangerous activities under the influence; availability of treatment for abuse.   -Injury prevention: Discussed safety belts, safety helmets, smoke detector, smoking near bedding or upholstery.   -Sexuality: Discussed sexually transmitted  diseases, partner selection, use of condoms, avoidance of unintended pregnancy and contraceptive alternatives.   -Dental health: Discussed importance of regular tooth brushing, flossing, and dental visits.  -Health maintenance and immunizations reviewed. Please refer to Health maintenance section.  Return to care in 1 year for next preventative visit.     Subjective:  HPI:  He has a few issues that he would like to discuss today.  See A/P for status of chronic conditions.  Since our last visit he has been working with chiropractor for cervical adenopathy.  Symptoms seem to be improving.  He still has persistent GERD symptoms.  He has been trying to wean off on Protonix.  He is taking about once every 3 to 4 days which seems to be working well.  He has not noticed any specific food triggers.  He has also had worsening tingling on his penis.  This is intermittent.  Has been worsening for the last 6 weeks or so.  He had something similar several years ago that was treated with physical therapy which worked however took several sessions of physical therapy for symptoms to improve.  No recent injuries.  No obvious precipitating events.  No specific treatments tried.  Has also had worsening area of inflammation on his left upper eyelid.  Has tried using Vaseline with modest improvement.   Lifestyle Diet: Balanced. Trying to get more fiber and protein Exercise: Working on elliptical. Walks 30 minutes per day.      07/31/2021    8:39 AM  Depression screen PHQ 2/9  Decreased Interest 0  Down, Depressed, Hopeless 0  PHQ - 2 Score 0    There are no preventive care reminders to display for this  patient.   ROS: Per HPI, otherwise a complete review of systems was negative.   PMH:  The following were reviewed and entered/updated in epic: Past Medical History:  Diagnosis Date   GERD (gastroesophageal reflux disease)    Patient Active Problem List   Diagnosis Date Noted   Cervical  radiculopathy 07/31/2021   Eczema 07/31/2021   Osteoarthritis 07/31/2021   GERD (gastroesophageal reflux disease) 04/07/2020   Right hip pain 07/07/2019   Hyperglycemia 01/01/2019   Dyslipidemia 07/07/2018   Past Surgical History:  Procedure Laterality Date   COLONOSCOPY  12/09/14   in [place]   COLONOSCOPY  01/28/2015   SLF: 1. No source for abdominal pain identified 2. Normal ileum, colon and rectum   COLONOSCOPY N/A 01/28/2015   Procedure: COLONOSCOPY;  Surgeon: West Bali, MD;  [facility]: [facility];  Service: Endoscopy;  Laterality: N/A;   ESOPHAGOGASTRODUODENOSCOPY  01/28/2015   SLF: 1. No source fir abdominal pain identified   ESOPHAGOGASTRODUODENOSCOPY N/A 01/28/2015   Procedure: ESOPHAGOGASTRODUODENOSCOPY (EGD);  Surgeon: West Bali, MD;  Location: [facility];  Service: Endoscopy;  Laterality: N/A;  0730   GIVENS CAPSULE STUDY N/A 12/31/2014   Procedure: GIVENS CAPSULE STUDY;  Surgeon: West Bali, MD;  Location: [facility];  Service: Endoscopy;  Laterality: N/A;  0700   REFRACTIVE SURGERY      Family History  Problem Relation Age of Onset   High Cholesterol Mother    Colon polyps Father    Diabetes Father    High Cholesterol Father    Colon cancer Neg Hx    Stomach cancer Neg Hx    Liver disease Neg Hx    Pancreatic cancer Neg Hx    Esophageal cancer Neg Hx     Medications- reviewed and updated Current Outpatient Medications  Medication Sig Dispense Refill   loratadine (CLARITIN) 10 MG tablet Take 10 mg by mouth daily.     Multiple Vitamins-Minerals (ALIVE MENS ENERGY PO) Take 1 tablet by mouth daily.     Probiotic Product (PROBIOTIC ADVANCED PO) Take 1 tablet by mouth daily.     pantoprazole (PROTONIX) 40 MG tablet Take 1 tablet (40 mg total) by mouth daily. 90 tablet 3   No current facility-administered medications for this visit.    Allergies-reviewed and updated No Known Allergies  Social History   Socioeconomic History    Marital status: Single    Spouse name: Not on file   Number of children: 0   Years of education: 16   Highest education level: Not on file  Occupational History   Occupation: [employer]  Tobacco Use   Smoking status: Never   Smokeless tobacco: Never  Vaping Use   Vaping Use: Never used  Substance and Sexual Activity   Alcohol use: No    Alcohol/week: 0.0 standard drinks of alcohol    Comment: 3-4 drinks in a week, none in the past 6 mos   Drug use: No   Sexual activity: Not Currently  Other Topics Concern   Not on file  Social History Narrative   Lives at home w/ his parents   Right-handed   No caffeine use recently   Social Determinants of Corporate investment banker Strain: Not on file  Food Insecurity: Not on file  Transportation Needs: Not on file  Physical Activity: Not on file  Stress: Not on file  Social Connections: Not on file        Objective:  Physical  Exam: BP 102/60   Pulse 73   Temp 97.8 F (36.6 C) (Temporal)   Ht 5\' 7"  (1.702 m)   Wt 160 lb 3.2 oz (72.7 kg)   SpO2 98%   BMI 25.09 kg/m   Body mass index is 25.09 kg/m. Wt Readings from Last 3 Encounters:  07/31/21 160 lb 3.2 oz (72.7 kg)  02/01/21 175 lb 9.6 oz (79.7 kg)  12/23/20 176 lb (79.8 kg)   Gen: NAD, resting comfortably HEENT: TMs normal bilaterally. OP clear. No thyromegaly noted.  CV: RRR with no murmurs appreciated Pulm: NWOB, CTAB with no crackles, wheezes, or rhonchi GI: Normal bowel sounds present. Soft, Nontender, Nondistended. MSK: no edema, cyanosis, or clubbing noted Skin: warm, dry.  Approximately 5 mm eczematous patch on left superior orbit Neuro: CN2-12 grossly intact. Strength 5/5 in upper and lower extremities. Reflexes symmetric and intact bilaterally.  Psych: Normal affect and thought content     Shaquayla Klimas M. 14/09/22, MD 07/31/2021 9:33 AM

## 2021-07-31 NOTE — Patient Instructions (Signed)
It was very nice to see you today!  I will refer you to pelvic floor rehab specialist.  We will check blood work today.  Please try using cortisone cream for your eyelid.  I will see back in year for your next physical.  Please come back to see me sooner if needed.  Take care, Dr Jimmey Ralph  PLEASE NOTE:  If you had any lab tests please let us know if you have not heard back within a few days. You may see your results on mychart before we have a chance to review them but we will give you a call once they are reviewed by [place]. If we ordered any referrals today, please let us know if you have not heard from their office within the next week.   Please try these tips to maintain a healthy lifestyle:  Eat at least 3 REAL meals and 1-2 snacks per day.  Aim for no more than 5 hours between eating.  If you eat breakfast, please do so within one hour of getting up.   Each meal should contain half fruits/vegetables, one quarter protein, and one quarter carbs (no bigger than a computer mouse)  Cut down on sweet beverages. This includes juice, soda, and sweet tea.   Drink at least 1 glass of water with each meal and aim for at least 8 glasses per day  Exercise at least 150 minutes every week.    Preventive Care 65-5 Years Old, Male Preventive care refers to lifestyle choices and visits with your health care provider that can promote health and wellness. Preventive care visits are also called wellness exams. What can I expect for my preventive care visit? Counseling During your preventive care visit, your health care provider may ask about your: Medical history, including: Past medical problems. Family medical history. Current health, including: Emotional well-being. Home life and relationship well-being. Sexual activity. Lifestyle, including: Alcohol, nicotine or tobacco, and drug use. Access to firearms. Diet, exercise, and sleep habits. Safety issues such as seatbelt and bike helmet  use. Sunscreen use. Work and work Astronomer. Physical exam Your health care provider will check your: Height and weight. These may be used to calculate your BMI (body mass index). BMI is a measurement that tells if you are at a healthy weight. Waist circumference. This measures the distance around your waistline. This measurement also tells if you are at a healthy weight and may help predict your risk of certain diseases, such as type 2 diabetes and high blood pressure. Heart rate and blood pressure. Body temperature. Skin for abnormal spots. What immunizations do I need?  Vaccines are usually given at various ages, according to a schedule. Your health care provider will recommend vaccines for you based on your age, medical history, and lifestyle or other factors, such as travel or where you work. What tests do I need? Screening Your health care provider may recommend screening tests for certain conditions. This may include: Lipid and cholesterol levels. Diabetes screening. This is done by checking your blood sugar (glucose) after you have not eaten for a while (fasting). Hepatitis B test. Hepatitis C test. HIV (human immunodeficiency virus) test. STI (sexually transmitted infection) testing, if you are at risk. Lung cancer screening. Prostate cancer screening. Colorectal cancer screening. Talk with your health care provider about your test results, treatment options, and if necessary, the need for more tests. Follow these instructions at home: Eating and drinking  Eat a diet that includes fresh fruits and vegetables, whole grains,  lean protein, and low-fat dairy products. Take vitamin and mineral supplements as recommended by your health care provider. Do not drink alcohol if your health care provider tells you not to drink. If you drink alcohol: Limit how much you have to 0-2 drinks a day. Know how much alcohol is in your drink. In the [place] one drink equals one 12 oz bottle of  beer (355 mL), one 5 oz glass of wine (148 mL), or one 1 oz glass of hard liquor (44 mL). Lifestyle Brush your teeth every morning and night with fluoride toothpaste. Floss one time each day. Exercise for at least 30 minutes 5 or more days each week. Do not use any products that contain nicotine or tobacco. These products include cigarettes, chewing tobacco, and vaping devices, such as e-cigarettes. If you need help quitting, ask your health care provider. Do not use drugs. If you are sexually active, practice safe sex. Use a condom or other form of protection to prevent STIs. Take aspirin only as told by your health care provider. Make sure that you understand how much to take and what form to take. Work with your health care provider to find out whether it is safe and beneficial for you to take aspirin daily. Find healthy ways to manage stress, such as: Meditation, yoga, or listening to music. Journaling. Talking to a trusted person. Spending time with friends and family. Minimize exposure to UV radiation to reduce your risk of skin cancer. Safety Always wear your seat belt while driving or riding in a vehicle. Do not drive: If you have been drinking alcohol. Do not ride with someone who has been drinking. When you are tired or distracted. While texting. If you have been using any mind-altering substances or drugs. Wear a helmet and other protective equipment during sports activities. If you have firearms in your house, make sure you follow all gun safety procedures. What's next? Go to your health care provider once a year for an annual wellness visit. Ask your health care provider how often you should have your eyes and teeth checked. Stay up to date on all vaccines. This information is not intended to replace advice given to you by your health care provider. Make sure you discuss any questions you have with your health care provider. Document Revised: 06/29/2020 Document Reviewed:  06/29/2020 Elsevier Patient Education  [employer].

## 2021-07-31 NOTE — Assessment & Plan Note (Signed)
Rash on left superior orbit consistent with mild eczema flare.  He can use topical hydrocortisone cream.  He will let me know if not improving and we can refer to dermatology.

## 2021-07-31 NOTE — Assessment & Plan Note (Signed)
Check lipids 

## 2021-08-01 NOTE — Progress Notes (Signed)
Please inform patient of the following:  His cholesterol is elevated but better than last year.  He should continue to work on diet and exercise and we can recheck this in a year.  His blood sugar is also borderline elevated but stable.  Everything else is normal and we can recheck in year.

## 2021-09-14 ENCOUNTER — Ambulatory Visit (INDEPENDENT_AMBULATORY_CARE_PROVIDER_SITE_OTHER): Payer: BC Managed Care – PPO

## 2021-09-14 DIAGNOSIS — Z23 Encounter for immunization: Secondary | ICD-10-CM | POA: Diagnosis not present

## 2022-08-02 ENCOUNTER — Encounter: Payer: BC Managed Care – PPO | Admitting: Family Medicine

## 2022-08-15 ENCOUNTER — Encounter: Payer: BC Managed Care – PPO | Admitting: Family Medicine

## 2022-08-15 ENCOUNTER — Encounter (INDEPENDENT_AMBULATORY_CARE_PROVIDER_SITE_OTHER): Payer: Self-pay

## 2022-08-29 ENCOUNTER — Ambulatory Visit: Payer: BC Managed Care – PPO | Admitting: Family Medicine

## 2022-08-29 ENCOUNTER — Encounter: Payer: Self-pay | Admitting: Family Medicine

## 2022-08-29 VITALS — BP 121/77 | HR 75 | Temp 98.4°F | Ht 67.0 in | Wt 159.8 lb

## 2022-08-29 DIAGNOSIS — K219 Gastro-esophageal reflux disease without esophagitis: Secondary | ICD-10-CM | POA: Diagnosis not present

## 2022-08-29 DIAGNOSIS — Z0001 Encounter for general adult medical examination with abnormal findings: Secondary | ICD-10-CM | POA: Diagnosis not present

## 2022-08-29 DIAGNOSIS — M1990 Unspecified osteoarthritis, unspecified site: Secondary | ICD-10-CM | POA: Diagnosis not present

## 2022-08-29 DIAGNOSIS — R739 Hyperglycemia, unspecified: Secondary | ICD-10-CM | POA: Diagnosis not present

## 2022-08-29 DIAGNOSIS — E785 Hyperlipidemia, unspecified: Secondary | ICD-10-CM | POA: Diagnosis not present

## 2022-08-29 LAB — CBC
HCT: 45.1 % (ref 39.0–52.0)
Hemoglobin: 14.8 g/dL (ref 13.0–17.0)
MCHC: 32.8 g/dL (ref 30.0–36.0)
MCV: 85.4 fl (ref 78.0–100.0)
Platelets: 299 10*3/uL (ref 150.0–400.0)
RBC: 5.28 Mil/uL (ref 4.22–5.81)
RDW: 14 % (ref 11.5–15.5)
WBC: 5.7 10*3/uL (ref 4.0–10.5)

## 2022-08-29 LAB — COMPREHENSIVE METABOLIC PANEL
ALT: 17 U/L (ref 0–53)
AST: 22 U/L (ref 0–37)
Albumin: 4.7 g/dL (ref 3.5–5.2)
Alkaline Phosphatase: 45 U/L (ref 39–117)
BUN: 13 mg/dL (ref 6–23)
CO2: 27 mEq/L (ref 19–32)
Calcium: 9.9 mg/dL (ref 8.4–10.5)
Chloride: 100 mEq/L (ref 96–112)
Creatinine, Ser: 1.04 mg/dL (ref 0.40–1.50)
GFR: 86.78 mL/min (ref >60.00)
Glucose, Bld: 95 mg/dL (ref 70–99)
Potassium: 4 mEq/L (ref 3.5–5.1)
Sodium: 136 mEq/L (ref 135–145)
Total Bilirubin: 1.3 mg/dL — ABNORMAL HIGH (ref 0.2–1.2)
Total Protein: 7.7 g/dL (ref 6.0–8.3)

## 2022-08-29 LAB — HEMOGLOBIN A1C: Hgb A1c MFr Bld: 6 % (ref 4.6–6.5)

## 2022-08-29 LAB — TSH: TSH: 2.36 u[IU]/mL (ref 0.35–5.50)

## 2022-08-29 LAB — LIPID PANEL
Cholesterol: 270 mg/dL — ABNORMAL HIGH (ref 0–200)
HDL: 50.2 mg/dL (ref >39.00)
NonHDL: 219.6
Total CHOL/HDL Ratio: 5
Triglycerides: 201 mg/dL — ABNORMAL HIGH (ref 0.0–149.0)
VLDL: 40.2 mg/dL — ABNORMAL HIGH (ref 0.0–40.0)

## 2022-08-29 LAB — LDL CHOLESTEROL, DIRECT: Direct LDL: 214 mg/dL

## 2022-08-29 MED ORDER — PANTOPRAZOLE SODIUM 40 MG PO TBEC: 40.0000 mg | DELAYED_RELEASE_TABLET | Freq: Every day | ORAL | 3 refills | Status: DC

## 2022-08-29 NOTE — Assessment & Plan Note (Signed)
Check A1c.  Discussed lifestyle modifications. °

## 2022-08-29 NOTE — Assessment & Plan Note (Addendum)
Check labs. Discussed lifestyle modifications.  May consider cardiac CT scan depending on results.  No family history of early coronary artery disease.

## 2022-08-29 NOTE — Patient Instructions (Addendum)
It was very nice to see you today!  We will check blood work.   Please keep working on diet and exercise.  Return in about 1 year (around 08/29/2023).   Take care, Dr Jimmey Ralph  PLEASE NOTE:  If you had any lab tests, please let us know if you have not heard back within a few days. You may see your results on mychart before we have a chance to review them but we will give you a call once they are reviewed by [place].   If we ordered any referrals today, please let us know if you have not heard from their office within the next week.   If you had any urgent prescriptions sent in today, please check with the pharmacy within an hour of our visit to make sure the prescription was transmitted appropriately.   Please try these tips to maintain a healthy lifestyle:  Eat at least 3 REAL meals and 1-2 snacks per day.  Aim for no more than 5 hours between eating.  If you eat breakfast, please do so within one hour of getting up.   Each meal should contain half fruits/vegetables, one quarter protein, and one quarter carbs (no bigger than a computer mouse)  Cut down on sweet beverages. This includes juice, soda, and sweet tea.   Drink at least 1 glass of water with each meal and aim for at least 8 glasses per day  Exercise at least 150 minutes every week.    Preventive Care 30-2 Years Old, Male Preventive care refers to lifestyle choices and visits with your health care provider that can promote health and wellness. Preventive care visits are also called wellness exams. What can I expect for my preventive care visit? Counseling During your preventive care visit, your health care provider may ask about your: Medical history, including: Past medical problems. Family medical history. Current health, including: Emotional well-being. Home life and relationship well-being. Sexual activity. Lifestyle, including: Alcohol, nicotine or tobacco, and drug use. Access to firearms. Diet, exercise, and  sleep habits. Safety issues such as seatbelt and bike helmet use. Sunscreen use. Work and work Astronomer. Physical exam Your health care provider will check your: Height and weight. These may be used to calculate your BMI (body mass index). BMI is a measurement that tells if you are at a healthy weight. Waist circumference. This measures the distance around your waistline. This measurement also tells if you are at a healthy weight and may help predict your risk of certain diseases, such as type 2 diabetes and high blood pressure. Heart rate and blood pressure. Body temperature. Skin for abnormal spots. What immunizations do I need?  Vaccines are usually given at various ages, according to a schedule. Your health care provider will recommend vaccines for you based on your age, medical history, and lifestyle or other factors, such as travel or where you work. What tests do I need? Screening Your health care provider may recommend screening tests for certain conditions. This may include: Lipid and cholesterol levels. Diabetes screening. This is done by checking your blood sugar (glucose) after you have not eaten for a while (fasting). Hepatitis B test. Hepatitis C test. HIV (human immunodeficiency virus) test. STI (sexually transmitted infection) testing, if you are at risk. Lung cancer screening. Prostate cancer screening. Colorectal cancer screening. Talk with your health care provider about your test results, treatment options, and if necessary, the need for more tests. Follow these instructions at home: Eating and drinking  Eat a  diet that includes fresh fruits and vegetables, whole grains, lean protein, and low-fat dairy products. Take vitamin and mineral supplements as recommended by your health care provider. Do not drink alcohol if your health care provider tells you not to drink. If you drink alcohol: Limit how much you have to 0-2 drinks a day. Know how much alcohol is in  your drink. In the [place] one drink equals one 12 oz bottle of beer (355 mL), one 5 oz glass of wine (148 mL), or one 1 oz glass of hard liquor (44 mL). Lifestyle Brush your teeth every morning and night with fluoride toothpaste. Floss one time each day. Exercise for at least 30 minutes 5 or more days each week. Do not use any products that contain nicotine or tobacco. These products include cigarettes, chewing tobacco, and vaping devices, such as e-cigarettes. If you need help quitting, ask your health care provider. Do not use drugs. If you are sexually active, practice safe sex. Use a condom or other form of protection to prevent STIs. Take aspirin only as told by your health care provider. Make sure that you understand how much to take and what form to take. Work with your health care provider to find out whether it is safe and beneficial for you to take aspirin daily. Find healthy ways to manage stress, such as: Meditation, yoga, or listening to music. Journaling. Talking to a trusted person. Spending time with friends and family. Minimize exposure to UV radiation to reduce your risk of skin cancer. Safety Always wear your seat belt while driving or riding in a vehicle. Do not drive: If you have been drinking alcohol. Do not ride with someone who has been drinking. When you are tired or distracted. While texting. If you have been using any mind-altering substances or drugs. Wear a helmet and other protective equipment during sports activities. If you have firearms in your house, make sure you follow all gun safety procedures. What's next? Go to your health care provider once a year for an annual wellness visit. Ask your health care provider how often you should have your eyes and teeth checked. Stay up to date on all vaccines. This information is not intended to replace advice given to you by your health care provider. Make sure you discuss any questions you have with your health care  provider. Document Revised: 06/29/2020 Document Reviewed: 06/29/2020 Elsevier Patient Education  2024 [employer].

## 2022-08-29 NOTE — Progress Notes (Signed)
22  Chief Complaint:  Alexander Aguirre is a 47 y.o. male who presents today for his annual comprehensive physical exam.    Assessment/Plan:  Chronic Problems Addressed Today: GERD (gastroesophageal reflux disease) Stable on protonix 40 mg daily. We will refill today.   Osteoarthritis Stable on OTC medications as needed.   Dyslipidemia Check labs. Discussed lifestyle modifications.  May consider cardiac CT scan depending on results.  No family history of early coronary artery disease.  Hyperglycemia Check A1c. Discussed lifestyle modifications.   Preventative Healthcare: Check labs. Due for colonoscopy in 2027.   Patient Counseling(The following topics were reviewed and/or handout was given):  -Nutrition: Stressed importance of moderation in sodium/caffeine intake, saturated fat and cholesterol, caloric balance, sufficient intake of fresh fruits, vegetables, and fiber.  -Stressed the importance of regular exercise.   -Substance Abuse: Discussed cessation/primary prevention of tobacco, alcohol, or other drug use; driving or other dangerous activities under the influence; availability of treatment for abuse.   -Injury prevention: Discussed safety belts, safety helmets, smoke detector, smoking near bedding or upholstery.   -Sexuality: Discussed sexually transmitted diseases, partner selection, use of condoms, avoidance of unintended pregnancy and contraceptive alternatives.   -Dental health: Discussed importance of regular tooth brushing, flossing, and dental visits.  -Health maintenance and immunizations reviewed. Please refer to Health maintenance section.  Return to care in 1 year for next preventative visit.     Subjective:  HPI:  He has no acute complaints today. See Assessment / plan for status of chronic conditions.   Lifestyle Diet: Balanced. Plenty of fruits and vegetables.  Exercise: Walking routinely and working on the eliptical.      08/29/2022    8:00 AM  Depression  screen PHQ 2/9  Decreased Interest 0  Down, Depressed, Hopeless 0  PHQ - 2 Score 0    There are no preventive care reminders to display for this patient.   ROS: Per HPI, otherwise a complete review of systems was negative.   PMH:  The following were reviewed and entered/updated in epic: Past Medical History:  Diagnosis Date   GERD (gastroesophageal reflux disease)    Patient Active Problem List   Diagnosis Date Noted   Cervical radiculopathy 07/31/2021   Eczema 07/31/2021   Osteoarthritis 07/31/2021   GERD (gastroesophageal reflux disease) 04/07/2020   Right hip pain 07/07/2019   Hyperglycemia 01/01/2019   Dyslipidemia 07/07/2018   Past Surgical History:  Procedure Laterality Date   COLONOSCOPY  12/09/14   in [place]   COLONOSCOPY  01/28/2015   SLF: 1. No source for abdominal pain identified 2. Normal ileum, colon and rectum   COLONOSCOPY N/A 01/28/2015   Procedure: COLONOSCOPY;  Surgeon: West Bali, MD;  Location: [facility];  Service: Endoscopy;  Laterality: N/A;   ESOPHAGOGASTRODUODENOSCOPY  01/28/2015   SLF: 1. No source fir abdominal pain identified   ESOPHAGOGASTRODUODENOSCOPY N/A 01/28/2015   Procedure: ESOPHAGOGASTRODUODENOSCOPY (EGD);  Surgeon: West Bali, MD;  Location: [facility];  Service: Endoscopy;  Laterality: N/A;  0730   GIVENS CAPSULE STUDY N/A 12/31/2014   Procedure: GIVENS CAPSULE STUDY;  Surgeon: West Bali, MD;  Location: [facility];  Service: Endoscopy;  Laterality: N/A;  0700   REFRACTIVE SURGERY      Family History  Problem Relation Age of Onset   High Cholesterol Mother    Colon polyps Father    Diabetes Father    High Cholesterol Father    Colon cancer Neg Hx  Stomach cancer Neg Hx    Liver disease Neg Hx    Pancreatic cancer Neg Hx    Esophageal cancer Neg Hx     Medications- reviewed and updated Current Outpatient Medications  Medication Sig Dispense Refill   loratadine (CLARITIN) 10 MG tablet Take  10 mg by mouth daily.     Multiple Vitamins-Minerals (ALIVE MENS ENERGY PO) Take 1 tablet by mouth daily.     Probiotic Product (PROBIOTIC ADVANCED PO) Take 1 tablet by mouth daily.     pantoprazole (PROTONIX) 40 MG tablet Take 1 tablet (40 mg total) by mouth daily. 90 tablet 3   No current facility-administered medications for this visit.    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  Tobacco Use   Smoking status: Never   Smokeless tobacco: Never  Vaping Use   Vaping status: Never Used  Substance and Sexual Activity   Alcohol use: No    Alcohol/week: 0.0 standard drinks of alcohol    Comment: 3-4 drinks in a week, none in the past 6 mos   Drug use: No   Sexual activity: Not Currently  Other Topics Concern   Not on file  Social History Narrative   Lives at home w/ his parents   Right-handed   No caffeine use recently   Social Determinants of Corporate investment banker Strain: Not on file  Food Insecurity: Not on file  Transportation Needs: Not on file  Physical Activity: Not on file  Stress: Not on file  Social Connections: Not on file        Objective:  Physical Exam: BP 121/77   Pulse 75   Temp 98.4 F (36.9 C) (Temporal)   Ht 5\' 7"  (1.702 m)   Wt 159 lb 12.8 oz (72.5 kg)   SpO2 98%   BMI 25.03 kg/m   Body mass index is 25.03 kg/m. Wt Readings from Last 3 Encounters:  08/29/22 159 lb 12.8 oz (72.5 kg)  07/31/21 160 lb 3.2 oz (72.7 kg)  02/01/21 175 lb 9.6 oz (79.7 kg)  Gen: NAD, resting comfortably HEENT: TMs normal bilaterally. OP clear. No thyromegaly noted.  CV: RRR with no murmurs appreciated Pulm: NWOB, CTAB with no crackles, wheezes, or rhonchi GI: Normal bowel sounds present. Soft, Nontender,  Nondistended. MSK: no edema, cyanosis, or clubbing noted Skin: warm, dry Neuro: CN2-12 grossly intact. Strength 5/5 in upper and lower extremities. Reflexes symmetric and intact bilaterally.  Psych: Normal affect and thought content     Nadya Hopwood M. Jimmey Ralph, MD 08/29/2022 8:29 AM

## 2022-08-29 NOTE — Assessment & Plan Note (Signed)
Stable on protonix 40 mg daily. We will refill today.

## 2022-08-29 NOTE — Assessment & Plan Note (Signed)
Stable on OTC medications as needed.  ?

## 2022-09-03 NOTE — Progress Notes (Signed)
His cholesterol level is very elevated.  It is much higher than we checked a year ago.  It would probably be a good idea to start him on a cholesterol medication to reduce his cholesterol levels and reduce risk of heart attack and stroke.  Please send in Lipitor 40 mg daily if he is agreeable to start.  We should recheck in 3 to 6 months.  He should continue to work on diet and exercise as well.  His blood sugar was also borderline elevated but stable compared to previous.  We can recheck this in year or so.  The rest of his labs are all stable.

## 2022-09-04 ENCOUNTER — Other Ambulatory Visit (HOSPITAL_COMMUNITY): Payer: Self-pay

## 2022-09-04 ENCOUNTER — Telehealth: Payer: Self-pay

## 2022-09-04 NOTE — Telephone Encounter (Signed)
Pharmacy Patient Advocate Encounter   Received notification from CoverMyMeds that prior authorization for Pantoprazole is required/requested.   Insurance verification completed.   The patient is insured through [facility] .   Per test claim: PA required; [place] [employer] via CoverMyMeds Key/confirmation #/EOC [id number] Status is pending

## 2022-09-05 ENCOUNTER — Other Ambulatory Visit: Payer: Self-pay | Admitting: *Deleted

## 2022-09-05 MED ORDER — ATORVASTATIN CALCIUM 40 MG PO TABS: 40.0000 mg | ORAL_TABLET | Freq: Every day | ORAL | 1 refills | Status: DC

## 2022-09-10 ENCOUNTER — Other Ambulatory Visit (HOSPITAL_COMMUNITY): Payer: Self-pay

## 2022-09-10 NOTE — Telephone Encounter (Signed)
Pharmacy Patient Advocate Encounter  Received notification from [facility] that Prior Authorization for Pantoprazole 40mg  tabs has been APPROVED from 09/04/2022 to 09/04/2023   PA #/Case ID/Reference #: [id number]

## 2022-10-04 ENCOUNTER — Encounter: Payer: Self-pay | Admitting: Family Medicine

## 2022-10-04 ENCOUNTER — Other Ambulatory Visit: Payer: Self-pay | Admitting: Family Medicine

## 2022-10-05 NOTE — Telephone Encounter (Signed)
I appreciate the update. Please send in refill for 90 day supply with 3 refills.  Katina Degree. Jimmey Ralph, MD 10/05/2022 11:13 AM

## 2022-10-05 NOTE — Telephone Encounter (Signed)
Please advise 

## 2022-10-09 ENCOUNTER — Other Ambulatory Visit: Payer: Self-pay | Admitting: *Deleted

## 2022-10-09 MED ORDER — ATORVASTATIN CALCIUM 40 MG PO TABS: 40.0000 mg | ORAL_TABLET | Freq: Every day | ORAL | 3 refills | Status: DC

## 2023-08-30 ENCOUNTER — Encounter: Payer: BC Managed Care – PPO | Admitting: Family Medicine

## 2023-10-18 ENCOUNTER — Ambulatory Visit: Admitting: Family Medicine

## 2023-10-18 VITALS — BP 116/82 | HR 82 | Temp 98.0°F | Ht 67.0 in | Wt 169.2 lb

## 2023-10-18 DIAGNOSIS — M26629 Arthralgia of temporomandibular joint, unspecified side: Secondary | ICD-10-CM

## 2023-10-18 DIAGNOSIS — Z0001 Encounter for general adult medical examination with abnormal findings: Secondary | ICD-10-CM

## 2023-10-18 DIAGNOSIS — R140 Abdominal distension (gaseous): Secondary | ICD-10-CM

## 2023-10-18 DIAGNOSIS — R739 Hyperglycemia, unspecified: Secondary | ICD-10-CM | POA: Diagnosis not present

## 2023-10-18 DIAGNOSIS — E785 Hyperlipidemia, unspecified: Secondary | ICD-10-CM

## 2023-10-18 DIAGNOSIS — L649 Androgenic alopecia, unspecified: Secondary | ICD-10-CM | POA: Diagnosis not present

## 2023-10-18 DIAGNOSIS — Z23 Encounter for immunization: Secondary | ICD-10-CM

## 2023-10-18 DIAGNOSIS — M1990 Unspecified osteoarthritis, unspecified site: Secondary | ICD-10-CM

## 2023-10-18 DIAGNOSIS — K219 Gastro-esophageal reflux disease without esophagitis: Secondary | ICD-10-CM | POA: Diagnosis not present

## 2023-10-18 DIAGNOSIS — R0789 Other chest pain: Secondary | ICD-10-CM

## 2023-10-18 LAB — HEMOGLOBIN A1C: Hgb A1c MFr Bld: 6.2 % (ref 4.6–6.5)

## 2023-10-18 LAB — COMPREHENSIVE METABOLIC PANEL WITH GFR
ALT: 17 U/L (ref 0–53)
AST: 19 U/L (ref 0–37)
Albumin: 4.8 g/dL (ref 3.5–5.2)
Alkaline Phosphatase: 53 U/L (ref 39–117)
BUN: 14 mg/dL (ref 6–23)
CO2: 29 meq/L (ref 19–32)
Calcium: 10 mg/dL (ref 8.4–10.5)
Chloride: 102 meq/L (ref 96–112)
Creatinine, Ser: 1.14 mg/dL (ref 0.40–1.50)
GFR: 77.11 mL/min (ref >60.00)
Glucose, Bld: 95 mg/dL (ref 70–99)
Potassium: 4.3 meq/L (ref 3.5–5.1)
Sodium: 139 meq/L (ref 135–145)
Total Bilirubin: 1.2 mg/dL (ref 0.2–1.2)
Total Protein: 7.5 g/dL (ref 6.0–8.3)

## 2023-10-18 LAB — CBC
HCT: 45.4 % (ref 39.0–52.0)
Hemoglobin: 14.9 g/dL (ref 13.0–17.0)
MCHC: 32.8 g/dL (ref 30.0–36.0)
MCV: 84.4 fl (ref 78.0–100.0)
Platelets: 304 K/uL (ref 150.0–400.0)
RBC: 5.37 Mil/uL (ref 4.22–5.81)
RDW: 13.8 % (ref 11.5–15.5)
WBC: 5 K/uL (ref 4.0–10.5)

## 2023-10-18 LAB — LIPID PANEL
Cholesterol: 247 mg/dL — ABNORMAL HIGH (ref 0–200)
HDL: 42 mg/dL (ref >39.00)
LDL Cholesterol: 152 mg/dL — ABNORMAL HIGH (ref 0–99)
NonHDL: 205.41
Total CHOL/HDL Ratio: 6
Triglycerides: 268 mg/dL — ABNORMAL HIGH (ref 0.0–149.0)
VLDL: 53.6 mg/dL — ABNORMAL HIGH (ref 0.0–40.0)

## 2023-10-18 LAB — TSH: TSH: 2.07 u[IU]/mL (ref 0.35–5.50)

## 2023-10-18 MED ORDER — PANTOPRAZOLE SODIUM 40 MG PO TBEC: 40.0000 mg | DELAYED_RELEASE_TABLET | Freq: Every day | ORAL | 3 refills | Status: AC

## 2023-10-18 NOTE — Progress Notes (Signed)
 Chief Complaint:  Alexander Aguirre is a 48 y.o. male who presents today for his annual comprehensive physical exam.    Assessment/Plan:  New/Acute Problems: Chest Pain  History atypical for cardiac etiology.  History is consistent with costochondritis/musculoskeletal etiology.  Does have mild tenderness on palpation of the left chest wall but not currently having any chest pain at rest and has not had any recurrence over the last couple days.  Had similar episode a few months ago which sounded like musculoskeletal etiology as well.  He does have elevated cholesterol however no other significant risk factors and no significant family history of cardiac disease.  We are checking cardiac CT scan as below to screen for coronary artery disease.  He will let us  know if he has any recurrence of pain.  Bloating No red flags.  Reassuring exam.  Likely due to dietary intolerances.  Recommended probiotics and fiber supplementation.  Also discussed low FODMAP eating plan.  He will let us  know if not improving.  Chronic Problems Addressed Today: Dyslipidemia Patient did not tolerate the Lipitor last year due to side effects.  He has been trying over-the-counter fish oil supplements and has recently been ongoing for the last 30 days or so.  Does have a family history of hypercholesterolemia as well.  Will recheck labs today and he is agreeable to cardiac CT scan.  Will place this order today.  Will likely not start any meds until we get back his CT scan.  Hyperglycemia Check A1c with labs.  GERD (gastroesophageal reflux disease) Stable on Protonix  40 mg daily.  Will refill today.  Osteoarthritis Manageable via over-the-counter meds.  Also sees chiropractor routinely.  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  He is not interested in further treatment at this point.  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  Preventative Healthcare: Flu shot given today.  Check labs.  Due for colonoscopy in 2027.  Patient Counseling(The following topics were reviewed and/or handout was given):  -Nutrition: Stressed importance of moderation in sodium/caffeine intake, saturated fat and cholesterol, caloric balance, sufficient intake of fresh fruits, vegetables, and fiber.  -Stressed the importance of regular exercise.   -Substance Abuse: Discussed cessation/primary prevention of tobacco, alcohol, or other drug use; driving or other dangerous activities under the influence; availability of treatment for abuse.   -Injury prevention: Discussed safety belts, safety helmets, smoke detector, smoking near bedding or upholstery.   -Sexuality: Discussed sexually transmitted diseases, partner selection, use of condoms, avoidance of unintended pregnancy and contraceptive alternatives.   -Dental health: Discussed importance of regular tooth brushing, flossing, and dental visits.  -Health maintenance and immunizations reviewed. Please refer to Health maintenance section.  Return to care in 1 year for next preventative visit.     Subjective:  HPI:  He has no acute complaints today. Patient is here today for his annual physical.  See assessment / plan for status of chronic conditions.  He has several issues that he would like to discuss today  Discussed the use of AI scribe software for clinical note transcription with the patient, who gave verbal consent to proceed.  History of Present Illness Alexander Aguirre is a 48 year old male who presents for an annual physical exam with multiple health concerns.  He experiences intermittent chest pain, initially occurring the day after July 4th while at work. The pain is localized around the chest area and does not  feel like a heart attack. It improved by the end of July but recurred this past Monday. The pain sometimes worsens with certain movements or pressure. No current  chest pain, nausea, vomiting, or shortness of breath.  He has a history of acid reflux, managed by sleeping on his back or left side and taking Protonix . He takes half a pill every forty days but sometimes needs a full pill for two to three days. Recently, he has experienced increased bloating, especially after eating certain foods like avocado and alcohol, and reports mild constipation. He does not take fiber supplements.  He stopped taking atorvastatin  in early 2025 due to concerns about side effects potentially related to his TMJ issues. He has a family history of high cholesterol and has tried fish oil in the past, which helped lower his triglycerides but aggravated his acid reflux. He is currently trying a new supplement, Fatty15, for the past 31 days without side effects.  He reports weight gain and reduced physical activity due to knee and Achilles issues. He exercises by using the elliptical for 15-20 minutes and walking for 30 minutes on weekdays and an hour on weekends.  He has a history of a pinched nerve causing hand numbness, which is managed by a chiropractor. 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  He experiences hair thinning and has tried minoxidil and finasteride in the past but discontinued due to side effects.  He has TMJ disorder managed by a team of specialists, including a dentist and oral surgeon. He uses a mouth guard that does not put pressure on his back teeth, but recent adjustments have worsened his symptoms.  He notes worsening vision and has a history of LASIK surgery in 2000 with a touch-up in 2012. He plans to see an eye doctor soon.      08/29/2022    8:00 AM  Depression screen PHQ 2/9  Decreased Interest 0  Down, Depressed, Hopeless 0  PHQ - 2 Score 0    Health Maintenance Due  Topic Date Due   Hepatitis C Screening  Never done   Hepatitis B Vaccines 19-59 Average Risk (1 of 3 - 19+ 3-dose  series) Never done   Influenza Vaccine  08/16/2023   COVID-19 Vaccine (5 - 2025-26 season) 09/16/2023     ROS: Per HPI, otherwise a complete review of systems was negative.   PMH:  The following were reviewed and entered/updated in epic: Past Medical History:  Diagnosis Date   GERD (gastroesophageal reflux disease)    Patient Active Problem List   Diagnosis Date Noted   Androgenic alopecia 10/18/2023   TMJ arthralgia 10/18/2023   Cervical radiculopathy 07/31/2021   Eczema 07/31/2021   Osteoarthritis 07/31/2021   GERD (gastroesophageal reflux disease) 04/07/2020   Hyperglycemia 01/01/2019   Dyslipidemia 07/07/2018   Past Surgical History:  Procedure Laterality Date   COLONOSCOPY  12/09/14   in [place]   COLONOSCOPY  01/28/2015   SLF: 1. No source for abdominal pain identified 2. Normal ileum, colon and rectum   COLONOSCOPY N/A 01/28/2015   Procedure: COLONOSCOPY;  Surgeon: Margo LITTIE Haddock, MD;  [facility];  Service: Endoscopy;  Laterality: N/A;   ESOPHAGOGASTRODUODENOSCOPY  01/28/2015   SLF: 1. No source fir abdominal pain identified   ESOPHAGOGASTRODUODENOSCOPY N/A 01/28/2015   Procedure: ESOPHAGOGASTRODUODENOSCOPY (EGD);  Surgeon: Margo LITTIE Haddock, MD;  Location: [facility];  Service: Endoscopy;  Laterality: N/A;  0730   GIVENS CAPSULE STUDY N/A 12/31/2014   Procedure: GIVENS CAPSULE STUDY;  Surgeon: Margo LITTIE Haddock, MD;  Location: [facility];  Service: Endoscopy;  Laterality: N/A;  0700   REFRACTIVE SURGERY      Family History  Problem Relation Age of Onset   High Cholesterol Mother    Colon polyps Father    Diabetes Father    High Cholesterol Father    Colon cancer Neg Hx    Stomach cancer Neg Hx    Liver disease Neg Hx    Pancreatic cancer Neg Hx    Esophageal cancer Neg Hx     Medications- reviewed and updated Current Outpatient Medications  Medication Sig Dispense Refill   loratadine (CLARITIN) 10 MG tablet Take 10 mg by mouth  daily.     Multiple Vitamins-Minerals (ALIVE MENS ENERGY PO) Take 1 tablet by mouth daily.     Probiotic Product (PROBIOTIC ADVANCED PO) Take 1 tablet by mouth daily.     pantoprazole  (PROTONIX ) 40 MG tablet Take 1 tablet (40 mg total) by mouth daily. 90 tablet 3   No current facility-administered medications for this visit.    Allergies-reviewed and updated No Known Allergies  Social History   Socioeconomic History   Marital status: Single    Spouse name: Not on file   Number of children: 0   Years of education: 16   Highest education level: Bachelor's degree (e.g., BA, AB, BS)  Occupational History   Occupation: [employer]  Tobacco Use   Smoking status: Never   Smokeless tobacco: Never  Vaping Use   Vaping status: Never Used  Substance and Sexual Activity   Alcohol use: No    Alcohol/week: 0.0 standard drinks of alcohol    Comment: 3-4 drinks in a week, none in the past 6 mos   Drug use: No   Sexual activity: Not Currently  Other Topics Concern   Not on file  Social History Narrative   Lives at home w/ his parents   Right-handed   No caffeine use recently   Social Drivers of Corporate investment banker Strain: Low Risk  (10/13/2023)   Overall Financial Resource Strain (CARDIA)    Difficulty of Paying Living Expenses: Not hard at all  Food Insecurity: No Food Insecurity (10/13/2023)   Hunger Vital Sign    Worried About Running Out of Food in the Last Year: Never true    Ran Out of Food in the Last Year: Never true  Transportation Needs: No Transportation Needs (10/13/2023)   PRAPARE - Administrator, Civil Service (Medical): No    Lack of Transportation (Non-Medical): No  Physical Activity: Sufficiently Active (10/13/2023)   Exercise Vital Sign    Days of Exercise per Week: 5 days    Minutes of Exercise per Session: 40 min  Stress: Patient Declined (10/13/2023)   [employer] of Occupational Health - Occupational Stress Questionnaire    Feeling of  Stress: Patient declined  Social Connections: Socially Isolated (10/13/2023)   Social Connection and Isolation Panel    Frequency of Communication with Friends and Family: Three times a week    Frequency of Social Gatherings with Friends and Family: Once a week    Attends Religious Services: Never    Database administrator or Organizations: No    Attends Engineer, structural: Not on file    Marital Status: Never married        Objective:  Physical Exam:  BP 116/82   Pulse 82   Temp 98 F (36.7 C) (Oral)   Ht 5' 7 (1.702 m)   Wt 169 lb 3.2 oz (76.7 kg)   SpO2 96%   BMI 26.50 kg/m   Body mass index is 26.5 kg/m. Wt Readings from Last 3 Encounters:  10/18/23 169 lb 3.2 oz (76.7 kg)  08/29/22 159 lb 12.8 oz (72.5 kg)  07/31/21 160 lb 3.2 oz (72.7 kg)   Gen: NAD, resting comfortably HEENT: TMs normal bilaterally. OP clear. No thyromegaly noted.  CV: RRR with no murmurs appreciated Pulm: NWOB, CTAB with no crackles, wheezes, or rhonchi Chest: No deformities.  Mild tenderness to palpation along left lower chest wall. GI: Normal bowel sounds present. Soft, Nontender, Nondistended. MSK: no edema, cyanosis, or clubbing noted Skin: warm, dry Neuro: CN2-12 grossly intact. Strength 5/5 in upper and lower extremities. Reflexes symmetric and intact bilaterally.  Psych: Normal affect and thought content     Elihu Milstein M. Kennyth, MD 10/18/2023 9:32 AM

## 2023-10-18 NOTE — Addendum Note (Signed)
 Addended by: BEVELY CURTISTINE PARAS on: 10/18/2023 09:54 AM   Modules accepted: Orders

## 2023-10-18 NOTE — Patient Instructions (Addendum)
 It was very nice to see you today!  VISIT SUMMARY: Today, you had your annual physical exam where we discussed several health concerns including chest pain, acid reflux, high cholesterol, and hair loss. We also reviewed your exercise routine and dietary habits.  YOUR PLAN: COSTOCHONDRITIS: You have intermittent chest pain that is likely due to costochondritis, which is inflammation of the cartilage in your chest. -We will order a cardiac CT scan to rule out any heart-related issues. -If the pain comes back, you can take ibuprofen or naproxen. -If the pain continues, we may consider physical therapy.  GASTROESOPHAGEAL REFLUX DISEASE WITH BLOATING AND CONSTIPATION: Your acid reflux is currently managed with Protonix , but you have recently experienced more bloating and constipation. -Continue taking Protonix  as prescribed. -Consider adding a fiber supplement to your diet. -Try a new probiotic to help with your symptoms. -We discussed the FODMAP diet, which may help identify foods that trigger your symptoms.  HYPERCHOLESTEROLEMIA: You have high cholesterol, which runs in your family. You previously stopped taking atorvastatin  due to side effects. -We will order a cardiac CT scan to check for any calcification in your coronary arteries. -Once we have the results, we will discuss the best treatment options for you.   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 -We discussed limited treatment options, including the possibility of a hair transplant or stem cell therapy.  GENERAL HEALTH MAINTENANCE: We discussed your overall health, including exercise and diet. -Continue your regular exercise routine, including walking and using the elliptical. -Maintain a balanced diet with plenty of fruits, vegetables, and protein. -Get your [facility] shot at [facility]. -We will perform blood work and discuss the results along with  the cardiac CT scan findings once available.  Return in about 1 year (around 10/17/2024) for Annual Physical.   Take care, Dr Kennyth  PLEASE NOTE:  If you had any lab tests, please let us  know if you have not heard back within a few days. You may see your results on mychart before we have a chance to review them but we will give you a call once they are reviewed by us .   If we ordered any referrals today, please let us  know if you have not heard from their office within the next week.   If you had any urgent prescriptions sent in today, please check with the pharmacy within an hour of our visit to make sure the prescription was transmitted appropriately.   Please try these tips to maintain a healthy lifestyle:  Eat at least 3 REAL meals and 1-2 snacks per day.  Aim for no more than 5 hours between eating.  If you eat breakfast, please do so within one hour of getting up.   Each meal should contain half fruits/vegetables, one quarter protein, and one quarter carbs (no bigger than a computer mouse)  Cut down on sweet beverages. This includes juice, soda, and sweet tea.   Drink at least 1 glass of water  with each meal and aim for at least 8 glasses per day  Exercise at least 150 minutes every week.    Preventive Care 104-105 Years Old, Male Preventive care refers to lifestyle choices and visits with your health care provider that can promote health and wellness. Preventive care visits are also called wellness exams. What can I expect for my preventive care visit? Counseling During your preventive care visit, your health care provider may ask about  your: Medical history, including: Past medical problems. Family medical history. Current health, including: Emotional well-being. Home life and relationship well-being. Sexual activity. Lifestyle, including: Alcohol, nicotine or tobacco, and drug use. Access to firearms. Diet, exercise, and sleep habits. Safety issues such as seatbelt  and bike helmet use. Sunscreen use. Work and work Astronomer. Physical exam Your health care provider will check your: Height and weight. These may be used to calculate your BMI (body mass index). BMI is a measurement that tells if you are at a healthy weight. Waist circumference. This measures the distance around your waistline. This measurement also tells if you are at a healthy weight and may help predict your risk of certain diseases, such as type 2 diabetes and high blood pressure. Heart rate and blood pressure. Body temperature. Skin for abnormal spots. What immunizations do I need?  Vaccines are usually given at various ages, according to a schedule. Your health care provider will recommend vaccines for you based on your age, medical history, and lifestyle or other factors, such as travel or where you work. What tests do I need? Screening Your health care provider may recommend screening tests for certain conditions. This may include: Lipid and cholesterol levels. Diabetes screening. This is done by checking your blood sugar (glucose) after you have not eaten for a while (fasting). Hepatitis B test. Hepatitis C test. HIV (human immunodeficiency virus) test. STI (sexually transmitted infection) testing, if you are at risk. Lung cancer screening. Prostate cancer screening. Colorectal cancer screening. Talk with your health care provider about your test results, treatment options, and if necessary, the need for more tests. Follow these instructions at home: Eating and drinking  Eat a diet that includes fresh fruits and vegetables, whole grains, lean protein, and low-fat dairy products. Take vitamin and mineral supplements as recommended by your health care provider. Do not drink alcohol if your health care provider tells you not to drink. If you drink alcohol: Limit how much you have to 0-2 drinks a day. Know how much alcohol is in your drink. In the [place] one drink equals one  12 oz bottle of beer (355 mL), one 5 oz glass of wine (148 mL), or one 1 oz glass of hard liquor (44 mL). Lifestyle Brush your teeth every morning and night with fluoride toothpaste. Floss one time each day. Exercise for at least 30 minutes 5 or more days each week. Do not use any products that contain nicotine or tobacco. These products include cigarettes, chewing tobacco, and vaping devices, such as e-cigarettes. If you need help quitting, ask your health care provider. Do not use drugs. If you are sexually active, practice safe sex. Use a condom or other form of protection to prevent STIs. Take aspirin only as told by your health care provider. Make sure that you understand how much to take and what form to take. Work with your health care provider to find out whether it is safe and beneficial for you to take aspirin daily. Find healthy ways to manage stress, such as: Meditation, yoga, or listening to music. Journaling. Talking to a trusted person. Spending time with friends and family. Minimize exposure to UV radiation to reduce your risk of skin cancer. Safety Always wear your seat belt while driving or riding in a vehicle. Do not drive: If you have been drinking alcohol. Do not ride with someone who has been drinking. When you are tired or distracted. While texting. If you have been using any mind-altering substances or drugs.  Wear a helmet and other protective equipment during sports activities. If you have firearms in your house, make sure you follow all gun safety procedures. What's next? Go to your health care provider once a year for an annual wellness visit. Ask your health care provider how often you should have your eyes and teeth checked. Stay up to date on all vaccines. This information is not intended to replace advice given to you by your health care provider. Make sure you discuss any questions you have with your health care provider. Document Revised: 06/29/2020  Document Reviewed: 06/29/2020 Elsevier Patient Education  2024 [employer].

## 2023-10-18 NOTE — Assessment & Plan Note (Signed)
 Patient did not tolerate the Lipitor last year due to side effects.  He has been trying over-the-counter fish oil supplements and has recently been ongoing for the last 30 days or so.  Does have a family history of hypercholesterolemia as well.  Will recheck labs today and he is agreeable to cardiac CT scan.  Will place this order today.  Will likely not start any meds until we get back his CT scan.

## 2023-10-18 NOTE — Assessment & Plan Note (Signed)
 Stable on Protonix 40 mg daily.  Will refill today.

## 2023-10-18 NOTE — Assessment & Plan Note (Signed)
 Manageable via over-the-counter meds.  Also sees chiropractor routinely.

## 2023-10-18 NOTE — Assessment & Plan Note (Signed)
 Continue management per dentistry and oral surgeon.

## 2023-10-18 NOTE — Assessment & Plan Note (Signed)
Check A1c with labs. 

## 2023-10-18 NOTE — Assessment & Plan Note (Signed)
 He has tried finasteride and minoxidil but however discontinued due to concern for side effects.  He is not interested in further treatment at this point.

## 2023-10-21 ENCOUNTER — Ambulatory Visit: Payer: Self-pay | Admitting: Family Medicine

## 2023-10-21 NOTE — Progress Notes (Signed)
 His A1c and cholesterol are both borderline elevated but not to the point where he needs to start meds.  It is similar to his values over the last several years.  The rest of his labs are at goal.  We do not need to make any changes to his treatment plan at this time.  He should continue to work on diet and exercise and we can recheck everything in a year or so.

## 2023-11-08 ENCOUNTER — Ambulatory Visit (HOSPITAL_BASED_OUTPATIENT_CLINIC_OR_DEPARTMENT_OTHER)
Admission: RE | Admit: 2023-11-08 | Discharge: 2023-11-08 | Disposition: A | Discharge status: Home / Self Care | Payer: Self-pay | Source: Ambulatory Visit | Attending: Family Medicine | Admitting: Family Medicine

## 2023-11-08 DIAGNOSIS — Z0001 Encounter for general adult medical examination with abnormal findings: Secondary | ICD-10-CM | POA: Insufficient documentation

## 2023-11-08 DIAGNOSIS — E785 Hyperlipidemia, unspecified: Secondary | ICD-10-CM | POA: Insufficient documentation

## 2023-11-11 NOTE — Progress Notes (Signed)
 Great news! His cardiac CT score is 0.  This means he has no signs of calcification or blockages in his heart.  He should keep up the great work with diet and exercise and we can recheck everything in 5 to 10 years.

## 2024-10-21 ENCOUNTER — Encounter: Admitting: Family Medicine
